# Patient Record
Sex: Female | Born: 1990
Health system: Southern US, Community
[De-identification: ages and names within clinical notes are randomized; demographics above are authoritative.]

## PROBLEM LIST (undated history)

## (undated) DIAGNOSIS — J309 Allergic rhinitis, unspecified: Secondary | ICD-10-CM

## (undated) DIAGNOSIS — R569 Unspecified convulsions: Secondary | ICD-10-CM

## (undated) DIAGNOSIS — G40909 Epilepsy, unspecified, not intractable, without status epilepticus: Secondary | ICD-10-CM

## (undated) HISTORY — PX: EYE SURGERY: SHX253

## (undated) HISTORY — PX: HAND SURGERY: SHX662

## (undated) HISTORY — PX: OTHER SURGICAL HISTORY: SHX169

---

## 2012-04-16 ENCOUNTER — Emergency Department (INDEPENDENT_AMBULATORY_CARE_PROVIDER_SITE_OTHER): Payer: BC Managed Care – PPO

## 2012-04-16 ENCOUNTER — Ambulatory Visit (INDEPENDENT_AMBULATORY_CARE_PROVIDER_SITE_OTHER): Payer: BC Managed Care – PPO | Admitting: Sports Medicine

## 2012-04-16 ENCOUNTER — Emergency Department (INDEPENDENT_AMBULATORY_CARE_PROVIDER_SITE_OTHER)
Admission: EM | Admit: 2012-04-16 | Discharge: 2012-04-16 | Disposition: A | Discharge status: Home / Self Care | Payer: BC Managed Care – PPO | Source: Home / Self Care | Attending: Family Medicine | Admitting: Family Medicine

## 2012-04-16 ENCOUNTER — Encounter: Payer: Self-pay | Admitting: Emergency Medicine

## 2012-04-16 DIAGNOSIS — M25532 Pain in left wrist: Secondary | ICD-10-CM

## 2012-04-16 DIAGNOSIS — M25539 Pain in unspecified wrist: Secondary | ICD-10-CM

## 2012-04-16 DIAGNOSIS — G5630 Lesion of radial nerve, unspecified upper limb: Secondary | ICD-10-CM

## 2012-04-16 DIAGNOSIS — M25531 Pain in right wrist: Secondary | ICD-10-CM | POA: Insufficient documentation

## 2012-04-16 HISTORY — DX: Allergic rhinitis, unspecified: J30.9

## 2012-04-16 HISTORY — DX: Epilepsy, unspecified, not intractable, without status epilepticus: G40.909

## 2012-04-16 MED ORDER — MELOXICAM 15 MG PO TABS: ORAL_TABLET | ORAL | Status: DC

## 2012-04-16 NOTE — ED Notes (Signed)
Left wrist pain started hurting today

## 2012-04-16 NOTE — ED Provider Notes (Signed)
History     CSN: [id number]  Arrival date & time 04/16/12  1401   None     Chief Complaint  Patient presents with  . Wrist Pain   HPI Comments: Pt states that she noticed acute onset of wrist pain in L wrist.  Has had similar pain in the past.  Pt denies any acute injury or fall.  Has a hx/o epilepsy. Last fall from this was approx 6-7 months ago.  Pain mainly over radial styloid. Has had some burning that radiates down 2nd metacarpal.      Patient is a 21 y.o. female presenting with wrist pain.  Wrist Pain Chronicity: acute on chronic  The current episode started yesterday. The problem has not changed since onset.    Past Medical History  Diagnosis Date  . Epilepsy   . Allergic rhinitis   . Epilepsy     History reviewed. No pertinent past surgical history.  Family History  Problem Relation Age of Onset  . Hyperlipidemia Mother   . Hypertension Father     History  Substance Use Topics  . Smoking status: Never Smoker   . Smokeless tobacco: Not on file  . Alcohol Use: No    OB History    Grav Para Term Preterm Abortions TAB SAB Ect Mult Living                  Review of Systems  All other systems reviewed and are negative.    Allergies  Review of patient's allergies indicates no known allergies.  Home Medications   Current Outpatient Rx  Name  Route  Sig  Dispense  Refill  . IBUPROFEN 200 MG PO TABS   Oral   Take 200 mg by mouth every 6 (six) hours as needed.         Marland Kitchen LAMOTRIGINE 150 MG PO TABS   Oral   Take 150 mg by mouth daily.           BP 114/68  Pulse 59  Temp 97.8 F (36.6 C) (Oral)  Resp 14  Ht 5\' 6"  (1.676 m)  Wt 121 lb (54.885 kg)  BMI 19.53 kg/m2  SpO2 100%  LMP 03/22/2012  Physical Exam  Constitutional: She appears well-developed and well-nourished.  HENT:  Head: Normocephalic and atraumatic.  Eyes: Conjunctivae normal are normal. Pupils are equal, round, and reactive to light.  Neck: Normal range of motion.  Neck supple.  Cardiovascular: Normal rate and regular rhythm.   Pulmonary/Chest: Effort normal and breath sounds normal.  Abdominal: Soft.  Musculoskeletal:       Hands:   ED Course  Procedures (including critical care time)  Labs Reviewed - No data to display Dg Wrist Complete Left  04/16/2012  *RADIOLOGY REPORT*  Clinical Data: Wrist pain  LEFT WRIST - COMPLETE 3+ VIEW  Comparison: None.  Findings: No acute fracture.  No dislocation.  Unremarkable soft tissues.  Negative ulnar variance.  IMPRESSION: No acute bony pathology.   Original Report Authenticated By: Jolaine Click, M.D.      1. Wrist pain, left   2. Radial neuropathy       MDM  Relatively broad differential for wrist pain. ? Radial neuropathy given distribution of pain and reproducibility with compression. Will consult sports medicine for formal evaluation.  Treatment plan per sports medicine.     The patient and/or caregiver has been counseled thoroughly with regard to treatment plan and/or medications prescribed including dosage, schedule, interactions, rationale for use, and  possible side effects and they verbalize understanding. Diagnoses and expected course of recovery discussed and will return if not improved as expected or if the condition worsens. Patient and/or caregiver verbalized understanding.             Doree Albee, MD 04/16/12 1536

## 2012-04-16 NOTE — Assessment & Plan Note (Signed)
Pain localized at the base of the second metacarpal, with recent URI, makes me suspect a synovitis. Meloxicam, wrist brace. She will come back to see me in one to 2 weeks, as she has had migratory arthralgia, I would certainly like to consider rheumatoid factor, ESR, and CRP levels if no better.

## 2012-04-16 NOTE — Progress Notes (Signed)
SPORTS MEDICINE CONSULTATION REPORT  Subjective:    I'm seeing this patient as a consultation for:  Dr. Alvester Morin  CC: Wrist pain  HPI: Megan Mueller is a very pleasant 21 year old female who comes in with a one-day history of pain that she localizes at the base of the second metacarpal bone of the dorsum of her left hand. She denies any trauma, denies any change in activity. The pain is localized, and really does not radiate. The patient, and present when flexing the wrist, and with palpation. She does recall in the past she's had pain on the other wrist as well, and it tends to alternate. She denies any fevers, chills, or night sweats, but does note a 30 pound weight loss over the past month or 2. She did discontinue Depakote around that time. She denies any rashes. The pain is moderate.  Past medical history, Surgical history, Family history, Social history, Allergies, and medications have been entered into the medical record, reviewed, and no changes needed.   Review of Systems: No headache, visual changes, nausea, vomiting, diarrhea, constipation, dizziness, abdominal pain, skin rash, fevers, chills, night sweats, weight loss, swollen lymph nodes, body aches, joint swelling, muscle aches, chest pain, shortness of breath, mood changes, visual or auditory hallucinations.   Objective:   Vitals:  Afebrile, vital signs stable. General: Well Developed, well nourished, and in no acute distress.  Neuro/Psych: Alert and oriented x3, extra-ocular muscles intact, able to move all 4 extremities.  Skin: Warm and dry, no rashes noted.  Respiratory: Not using accessory muscles, speaking in full sentences, trachea midline.  Cardiovascular: Pulses palpable, no extremity edema. Abdomen: Does not appear distended. Right Wrist: Inspection normal with no visible erythema or swelling. ROM smooth and normal with good flexion and extension and ulnar/radial deviation that is symmetrical with opposite wrist. Palpation is  normal over metacarpals, navicular, lunate, and TFCC; tendons without tenderness/ swelling, there is tenderness to palpation of the dorsal base of the second metacarpal bone. No snuffbox tenderness. No tenderness over Canal of Guyon. Strength 5/5 in all directions without pain. Negative Finkelstein, tinel's and phalens. Negative Watson's test.  I reviewed her x-rays, and they were negative for any sign of fracture, dislocation, degenerative change, or erosions.  Impression and Recommendations:   This case required medical decision making of moderate complexity.

## 2013-10-02 ENCOUNTER — Emergency Department (INDEPENDENT_AMBULATORY_CARE_PROVIDER_SITE_OTHER)
Admission: EM | Admit: 2013-10-02 | Discharge: 2013-10-02 | Disposition: A | Discharge status: Home / Self Care | Payer: BC Managed Care – PPO | Source: Home / Self Care | Attending: Family Medicine | Admitting: Family Medicine

## 2013-10-02 ENCOUNTER — Encounter: Payer: Self-pay | Admitting: Emergency Medicine

## 2013-10-02 DIAGNOSIS — N3000 Acute cystitis without hematuria: Secondary | ICD-10-CM

## 2013-10-02 LAB — POCT URINE PREGNANCY: PREG TEST UR: NEGATIVE

## 2013-10-02 LAB — POCT URINALYSIS DIP (MANUAL ENTRY)
BILIRUBIN UA: NEGATIVE
BILIRUBIN UA: NEGATIVE
GLUCOSE UA: NEGATIVE
Leukocytes, UA: NEGATIVE
Nitrite, UA: NEGATIVE
Protein Ur, POC: NEGATIVE
RBC UA: NEGATIVE
SPEC GRAV UA: 1.02 (ref 1.005–1.03)
UROBILINOGEN UA: 0.2 (ref 0–1)
pH, UA: 8 (ref 5–8)

## 2013-10-02 MED ORDER — NITROFURANTOIN MONOHYD MACRO 100 MG PO CAPS: 100.0000 mg | ORAL_CAPSULE | Freq: Two times a day (BID) | ORAL | Status: DC

## 2013-10-02 NOTE — ED Provider Notes (Signed)
CSN: [id number]     Arrival date & time 10/02/13  1240 History   First MD Initiated Contact with Patient 10/02/13 1328     Chief Complaint  Patient presents with  . Dysuria  . Urinary Frequency     HPI Comments: Patient complains of persistent frequency and mild dysuria and urgency for about a week.  No nocturia.  No abdominal or pelvic pain.  No nausea/vomiting.  No fevers, chills, and sweats.  Patient's last menstrual period was 09/01/2013.   Patient is a 23 y.o. female presenting with dysuria. The history is provided by the patient.  Dysuria Pain quality:  Burning Pain severity:  Mild Onset quality:  Gradual Duration:  5 days Timing:  Constant Progression:  Unchanged Chronicity:  Recurrent Recent urinary tract infections: no   Relieved by:  Nothing Worsened by:  Nothing tried Ineffective treatments:  Cranberry juice Urinary symptoms: frequent urination and hesitancy   Urinary symptoms: no discolored urine, no foul-smelling urine, no hematuria and no bladder incontinence   Associated symptoms: no abdominal pain, no fever, no flank pain, no genital lesions, no nausea, no vaginal discharge and no vomiting     Past Medical History  Diagnosis Date  . Epilepsy   . Allergic rhinitis   . Epilepsy    History reviewed. No pertinent past surgical history. Family History  Problem Relation Age of Onset  . Hyperlipidemia Mother   . Hypertension Father    History  Substance Use Topics  . Smoking status: Never Smoker   . Smokeless tobacco: Not on file  . Alcohol Use: No   OB History   Grav Para Term Preterm Abortions TAB SAB Ect Mult Living                 Review of Systems  Constitutional: Negative for fever.  Gastrointestinal: Negative for nausea, vomiting and abdominal pain.  Genitourinary: Positive for dysuria. Negative for flank pain and vaginal discharge.  All other systems reviewed and are negative.   Allergies  Review of patient's allergies indicates no known  allergies.  Home Medications   Prior to Admission medications   Medication Sig Start Date End Date Taking? Authorizing Provider  ibuprofen (ADVIL,MOTRIN) 200 MG tablet Take 200 mg by mouth every 6 (six) hours as needed.    Historical Provider, MD  lamoTRIgine (LAMICTAL) 150 MG tablet Take 150 mg by mouth daily.    Historical Provider, MD  meloxicam (MOBIC) 15 MG tablet One tab PO qAM with breakfast for 2 weeks, then daily prn pain. 04/16/12   Monica Bectonhomas J Thekkekandam, MD  nitrofurantoin, macrocrystal-monohydrate, (MACROBID) 100 MG capsule Take 1 capsule (100 mg total) by mouth 2 (two) times daily. 10/02/13   Lattie HawStephen A Darnell Stimson, MD   BP 99/62  Pulse 78  Temp(Src) 98.3 F (36.8 C) (Oral)  Resp 16  Ht 5\' 4"  (1.626 m)  Wt 126 lb (57.153 kg)  BMI 21.62 kg/m2  SpO2 99%  LMP 09/01/2013 Physical Exam Nursing notes and Vital Signs reviewed. Appearance:  Patient appears healthy, stated age, and in no acute distress Eyes:  Pupils are equal, round, and reactive to light and accomodation.  Extraocular movement is intact.  Conjunctivae are not inflamed  Pharynx:  Normal; moist mucous membranes  Neck:  Supple.  No adenopathy Lungs:  Clear to auscultation.  Breath sounds are equal.  Heart:  Regular rate and rhythm without murmurs, rubs, or gallops.  Abdomen:  Nontender without masses or hepatosplenomegaly.  Bowel sounds are present.  No  CVA or flank tenderness.  Extremities:  No edema.  No calf tenderness Skin:  No rash present.   ED Course  Procedures  None    Labs Reviewed  URINE CULTURE  POCT URINALYSIS DIP (MANUAL ENTRY) negative  POCT URINE PREGNANCY negative         MDM   1. Acute cystitis    Urine culture pending. Begin Macrobid. Followup with Family Doctor if not improved in one week.     Lattie HawStephen A Estephany Perot, MD 10/02/13 [id number]

## 2013-10-02 NOTE — Discharge Instructions (Signed)
Increase fluid intake. ° ° °Urinary Tract Infection °Urinary tract infections (UTIs) can develop anywhere along your urinary tract. Your urinary tract is your body's drainage system for removing wastes and extra water. Your urinary tract includes two kidneys, two ureters, a bladder, and a urethra. Your kidneys are a pair of bean-shaped organs. Each kidney is about the size of your fist. They are located below your ribs, one on each side of your spine. °CAUSES °Infections are caused by microbes, which are microscopic organisms, including fungi, viruses, and bacteria. These organisms are so small that they can only be seen through a microscope. Bacteria are the microbes that most commonly cause UTIs. °SYMPTOMS  °Symptoms of UTIs may vary by age and gender of the patient and by the location of the infection. Symptoms in young women typically include a frequent and intense urge to urinate and a painful, burning feeling in the bladder or urethra during urination. Older women and men are more likely to be tired, shaky, and weak and have muscle aches and abdominal pain. A fever may mean the infection is in your kidneys. Other symptoms of a kidney infection include pain in your back or sides below the ribs, nausea, and vomiting. °DIAGNOSIS °To diagnose a UTI, your caregiver will ask you about your symptoms. Your caregiver also will ask to provide a urine sample. The urine sample will be tested for bacteria and white blood cells. White blood cells are made by your body to help fight infection. °TREATMENT  °Typically, UTIs can be treated with medication. Because most UTIs are caused by a bacterial infection, they usually can be treated with the use of antibiotics. The choice of antibiotic and length of treatment depend on your symptoms and the type of bacteria causing your infection. °HOME CARE INSTRUCTIONS °· If you were prescribed antibiotics, take them exactly as your caregiver instructs you. Finish the medication even if  you feel better after you have only taken some of the medication. °· Drink enough water and fluids to keep your urine clear or pale yellow. °· Avoid caffeine, tea, and carbonated beverages. They tend to irritate your bladder. °· Empty your bladder often. Avoid holding urine for long periods of time. °· Empty your bladder before and after sexual intercourse. °· After a bowel movement, women should cleanse from front to back. Use each tissue only once. °SEEK MEDICAL CARE IF:  °· You have back pain. °· You develop a fever. °· Your symptoms do not begin to resolve within 3 days. °SEEK IMMEDIATE MEDICAL CARE IF:  °· You have severe back pain or lower abdominal pain. °· You develop chills. °· You have nausea or vomiting. °· You have continued burning or discomfort with urination. °MAKE SURE YOU:  °· Understand these instructions. °· Will watch your condition. °· Will get help right away if you are not doing well or get worse. °Document Released: 01/16/2005 Document Revised: 10/08/2011 Document Reviewed: 05/17/2011 °ExitCare® Patient Information ©2014 [employer]. ° °

## 2013-10-02 NOTE — ED Notes (Signed)
Reports frequency and dysuria x 3-5 days with varying intensity.

## 2013-10-04 LAB — URINE CULTURE

## 2013-10-05 ENCOUNTER — Telehealth: Payer: Self-pay | Admitting: Emergency Medicine

## 2013-10-08 ENCOUNTER — Emergency Department (INDEPENDENT_AMBULATORY_CARE_PROVIDER_SITE_OTHER): Payer: BC Managed Care – PPO

## 2013-10-08 ENCOUNTER — Emergency Department (INDEPENDENT_AMBULATORY_CARE_PROVIDER_SITE_OTHER)
Admission: EM | Admit: 2013-10-08 | Discharge: 2013-10-08 | Disposition: A | Discharge status: Home / Self Care | Payer: BC Managed Care – PPO | Source: Home / Self Care | Attending: Family Medicine | Admitting: Family Medicine

## 2013-10-08 ENCOUNTER — Encounter: Payer: Self-pay | Admitting: Emergency Medicine

## 2013-10-08 DIAGNOSIS — M25539 Pain in unspecified wrist: Secondary | ICD-10-CM

## 2013-10-08 DIAGNOSIS — M25531 Pain in right wrist: Secondary | ICD-10-CM

## 2013-10-08 NOTE — ED Provider Notes (Signed)
CSN: [id number]     Arrival date & time 10/08/13  1402 History   First MD Initiated Contact with Patient 10/08/13 1500     Chief Complaint  Patient presents with  . Hand Pain      HPI Comments: Patient was pushing down on a table yesterday with her wrists hyperflexed and developed pain in her right wrist.  She has had intermittent positional pain, such as when rotating her car steering wheel.  No recent injury.  Patient is a 23 y.o. female presenting with wrist pain. The history is provided by the patient.  Wrist Pain This is a new problem. The current episode started yesterday. Episode frequency: occasionally. The problem has not changed since onset.Associated symptoms comments: none. Exacerbated by: dorsiflexing wrist. Nothing relieves the symptoms. Treatments tried: ibuprofen. The treatment provided mild relief.    Past Medical History  Diagnosis Date  . Epilepsy   . Allergic rhinitis   . Epilepsy    Past Surgical History  Procedure Laterality Date  . Hand surgery Left   . Eye surgery Left    Family History  Problem Relation Age of Onset  . Hyperlipidemia Mother   . Hypertension Father   . Diabetes Father    History  Substance Use Topics  . Smoking status: Never Smoker   . Smokeless tobacco: Not on file  . Alcohol Use: No   OB History   Grav Para Term Preterm Abortions TAB SAB Ect Mult Living                 Review of Systems  All other systems reviewed and are negative.   Allergies  Review of patient's allergies indicates no known allergies.  Home Medications   Prior to Admission medications   Medication Sig Start Date End Date Taking? Authorizing Provider  ibuprofen (ADVIL,MOTRIN) 200 MG tablet Take 200 mg by mouth every 6 (six) hours as needed.    Historical Provider, MD  lamoTRIgine (LAMICTAL) 150 MG tablet Take 150 mg by mouth daily.    Historical Provider, MD  meloxicam (MOBIC) 15 MG tablet One tab PO qAM with breakfast for 2 weeks, then daily prn pain.  04/16/12   Monica Bectonhomas J Thekkekandam, MD  nitrofurantoin, macrocrystal-monohydrate, (MACROBID) 100 MG capsule Take 1 capsule (100 mg total) by mouth 2 (two) times daily. 10/02/13   Lattie HawStephen A Beese, MD   BP 100/60  Pulse 85  Temp(Src) 98.2 F (36.8 C) (Oral)  Resp 16  Ht 5\' 4"  (1.626 m)  Wt 127 lb (57.607 kg)  BMI 21.79 kg/m2  SpO2 98%  LMP 09/03/2013 Physical Exam  Nursing note and vitals reviewed. Constitutional: She is oriented to person, place, and time. She appears well-developed and well-nourished. No distress.  HENT:  Head: Atraumatic.  Eyes: Conjunctivae are normal. Pupils are equal, round, and reactive to light.  Musculoskeletal:       Right wrist: Normal.       Right hand: Normal. Normal sensation noted. Normal strength noted.  Neurological: She is alert and oriented to person, place, and time.  Skin: Skin is warm and dry. No rash noted.    ED Course  Procedures  none     Imaging Review Dg Wrist Complete Right  10/08/2013   CLINICAL DATA:  No injury, lateral pain right wrist  EXAM: RIGHT WRIST - COMPLETE 3+ VIEW  COMPARISON:  None.  FINDINGS: There is no evidence of fracture or dislocation. There is no evidence of arthropathy or other focal bone abnormality.  Soft tissues are unremarkable.  IMPRESSION: Negative.   Electronically Signed   By: Esperanza Heiraymond  Rubner M.D.   On: 10/08/2013 15:59     MDM   1. Wrist pain, right; note normal exam     May take Ibuprofen 200mg , 4 tabs every 8 hours with food when flare-ups of pain recur. Followup with Dr. Rodney Langtonhomas Thekkekandam ([facility])    Lattie HawStephen A Beese, MD 10/11/13 1004

## 2013-10-08 NOTE — ED Notes (Signed)
Pt c/o RT wrist and hand pain x 1 day. Denies injury. She took IBF last night with no relief.

## 2013-10-08 NOTE — Discharge Instructions (Signed)
May take Ibuprofen 200mg , 4 tabs every 8 hours with food when flare-ups of pain recur.

## 2013-10-13 ENCOUNTER — Other Ambulatory Visit: Payer: Self-pay | Admitting: Family Medicine

## 2013-10-14 ENCOUNTER — Telehealth: Payer: Self-pay | Admitting: Emergency Medicine

## 2013-10-14 NOTE — ED Notes (Signed)
Inquired about patient's status; encourage them to call with questions/concerns.  

## 2013-10-19 ENCOUNTER — Other Ambulatory Visit: Payer: Self-pay | Admitting: Family Medicine

## 2015-08-16 ENCOUNTER — Emergency Department
Admission: EM | Admit: 2015-08-16 | Discharge: 2015-08-16 | Disposition: A | Discharge status: Home / Self Care | Payer: BLUE CROSS/BLUE SHIELD | Source: Home / Self Care | Attending: Family Medicine | Admitting: Family Medicine

## 2015-08-16 ENCOUNTER — Encounter: Payer: Self-pay | Admitting: *Deleted

## 2015-08-16 DIAGNOSIS — Z8669 Personal history of other diseases of the nervous system and sense organs: Secondary | ICD-10-CM

## 2015-08-16 DIAGNOSIS — R112 Nausea with vomiting, unspecified: Secondary | ICD-10-CM | POA: Diagnosis not present

## 2015-08-16 LAB — POCT CBC W AUTO DIFF (K'VILLE URGENT CARE)

## 2015-08-16 MED ORDER — ONDANSETRON 4 MG PO TBDP
4.0000 mg | ORAL_TABLET | Freq: Once | ORAL | Status: AC
  Administered 2015-08-16: 4 mg via ORAL

## 2015-08-16 MED ORDER — SODIUM CHLORIDE 0.9 % IV BOLUS (SEPSIS)
1000.0000 mL | Freq: Once | INTRAVENOUS | Status: AC
  Administered 2015-08-16: 1000 mL via INTRAVENOUS

## 2015-08-16 MED ORDER — ONDANSETRON 4 MG PO TBDP
4.0000 mg | ORAL_TABLET | Freq: Four times a day (QID) | ORAL | Status: DC | PRN
  Administered 2015-08-16: 4 mg via ORAL

## 2015-08-16 MED ORDER — PROMETHAZINE HCL 25 MG PO TABS: 25.0000 mg | ORAL_TABLET | Freq: Four times a day (QID) | ORAL | Status: DC | PRN

## 2015-08-16 NOTE — ED Provider Notes (Signed)
CSN: [id number]     Arrival date & time 08/16/15  1454 History   None    Chief Complaint  Patient presents with  . Emesis   (Consider location/radiation/quality/duration/timing/severity/associated sxs/prior Treatment) HPI The pt is a 25yo female brought to [facility] by her mother with concern for persistent nausea and 3 episodes of vomiting since 8AM this morning after having 2 seizures.  Vomiting after seizures is normal for pt but usually resolves faster. She has been taking her Lamictal as prescribed with no missed doses.  Pt was playing video games last night, which she has done before, but thinks she may have taken a bath that was too hot, triggering her seizure.  She is c/o mild generalized abdominal pain with the vomiting.  First seizure this morning was around 3AM, unwitnessed, she woke up on the floor after she believes she fell out of her bed, then a second seizure 10-15 minutes later that lasted about 1-2 minutes.  Pt was in bed with her parents when the 2nd seizure started.  Pt does not have any nightstand or furniture where she fell out of bed but there were items on the floor she may have hit her head on. Pt denies headache, wounds, bumps, or bruises on her head. Denies known injury from the fall out of bed. No injury occurred during second seizure as she was in bed, witnessed by her parents.  Per medical records, pt did call her neurologist, Dr. Inez Catalina at [facility] this morning, who recommended pt increase the dose of her Lamictal, and called in a new prescription.  Pt came to [facility] for treatment of the continued nausea.  Last seizure before today was 2 years ago.  No recent illness including no cough, congestion, fever, or diarrhea. No sick contacts or recent travel.  Past Medical History  Diagnosis Date  . Epilepsy (HCC)   . Allergic rhinitis   . Epilepsy [facility])    Past Surgical History  Procedure Laterality Date  . Hand surgery Left   . Eye surgery Left    Family History  Problem  Relation Age of Onset  . Hyperlipidemia Mother   . Hypertension Father   . Diabetes Father    Social History  Substance Use Topics  . Smoking status: Never Smoker   . Smokeless tobacco: None  . Alcohol Use: No   OB History    No data available     Review of Systems  Constitutional: Negative for fever and chills.  HENT: Negative for congestion, ear pain, sore throat, trouble swallowing and voice change.   Eyes: Negative for photophobia, pain and visual disturbance.  Respiratory: Negative for cough and shortness of breath.   Cardiovascular: Negative for chest pain and palpitations.  Gastrointestinal: Positive for nausea and vomiting. Negative for abdominal pain and diarrhea.  Musculoskeletal: Negative for myalgias, back pain, arthralgias, neck pain and neck stiffness.  Skin: Negative for rash.  Neurological: Positive for seizures. Negative for dizziness, weakness, light-headedness, numbness and headaches.    Allergies  Review of patient's allergies indicates no known allergies.  Home Medications   Prior to Admission medications   Medication Sig Start Date End Date Taking? Authorizing Provider  AMITRIPTYLINE HCL PO Take by mouth.   Yes Historical Provider, MD  lamoTRIgine (LAMICTAL) 150 MG tablet Take 150 mg by mouth daily.   Yes Historical Provider, MD  promethazine (PHENERGAN) 25 MG tablet Take 1 tablet (25 mg total) by mouth every 6 (six) hours as needed for nausea or vomiting.  08/16/15   Junius FinnerErin O'Malley, PA-C   Meds Ordered and Administered this Visit   Medications  ondansetron (ZOFRAN-ODT) disintegrating tablet 4 mg (4 mg Oral Given 08/16/15 1655)  ondansetron (ZOFRAN-ODT) disintegrating tablet 4 mg (4 mg Oral Given 08/16/15 1519)  sodium chloride 0.9 % bolus 1,000 mL (1,000 mLs Intravenous Given 08/16/15 1553)    BP 117/77 mmHg  Pulse 72  Temp(Src) 98 F (36.7 C) (Oral)  Wt 120 lb (54.432 kg)  SpO2 99%  LMP 08/16/2015 No data found.   Physical Exam   Constitutional: She is oriented to person, place, and time. She appears well-developed and well-nourished. No distress.  Pt sitting on exam bed, texting on her phone. NAD.  HENT:  Head: Normocephalic and atraumatic.  Right Ear: Tympanic membrane normal.  Left Ear: Tympanic membrane normal.  Nose: Nose normal.  Mouth/Throat: Uvula is midline, oropharynx is clear and moist and mucous membranes are normal.  No evidence of head trauma.  Eyes: Conjunctivae and EOM are normal. Pupils are equal, round, and reactive to light. No scleral icterus.  Neck: Normal range of motion. Neck supple.  No midline bone tenderness, no crepitus or step-offs.   Cardiovascular: Normal rate, regular rhythm and normal heart sounds.   Pulmonary/Chest: Effort normal and breath sounds normal. No respiratory distress. She has no wheezes. She has no rales.  Abdominal: Soft. She exhibits no distension and no mass. There is no tenderness. There is no rebound and no guarding.  Musculoskeletal: Normal range of motion.  Neurological: She is alert and oriented to person, place, and time. She has normal strength. No cranial nerve deficit or sensory deficit. She displays a negative Romberg sign. Coordination and gait normal. GCS eye subscore is 4. GCS verbal subscore is 5. GCS motor subscore is 6.  CN II-XII in tact. Speech is clear. Alert to person, place, and time. Able to follow 2 step commands. Finger to nose coordination normal. Normal gait.   Skin: Skin is warm and dry. She is not diaphoretic.  Nursing note and vitals reviewed.   ED Course  Procedures (including critical care time)  Labs Review Labs Reviewed  LAMOTRIGINE LEVEL  BASIC METABOLIC PANEL WITH GFR  POCT CBC W AUTO DIFF ([facility])    Imaging Review No results found.    MDM   1. Non-intractable vomiting with nausea, unspecified vomiting type   2. History of epilepsy    Pt is a 25yo female with hx of epilepsy c/o nausea and vomiting after  2 seizures this morning. Pt unsure if she hit her head, however, no evidence of head trauma. Pt denies headache, dizziness or change in vision. Normal neuro exam.  Not on blood thinners.  Medical records reviewed including pt's telephone notes with neurologist, Dr. Inez Catalina'Donovan this morning.   No vomiting while in UC.  Pt given 1L IV fluids and 8mg  Zofran ODT  No vomiting while in UC.  Vitals WNL Labs: CBC- unremarkable.  BMP and Lamictal level: pending.  Will hold off on CT scan Pt is being driven by her mother, lives with her parents. Pt feels comfortable being discharged home. Advised not to drive until cleared by her neurologist. Take new Lamictal dose starting this evening.  F/u with Neurologist for continued monitoring of medication and seizures. Discussed symptoms that warrant emergent care in the ED. Patient verbalized understanding and agreement with treatment plan.    Junius Finnerrin O'Malley, PA-C 08/16/15 1738

## 2015-08-16 NOTE — ED Notes (Signed)
Pt c/o nausea and vomiting since 8am this morning. She has tried fluids but is unable to hold them down currently. Abdominal pain only while vomiting. She has a h/o epilepsy, and had 2 seizures last night. She typically has nausea and some vomiting after seizures but "never this bad".

## 2015-08-16 NOTE — Discharge Instructions (Signed)
Be sure to pick up your new prescription of Lamictal and take as prescribed by your neurologist.  If you continue to have seizures, unable to keep down fluids, severe headache, dizziness, difficulty walking, or other new concerning symptoms develop, please call 911 or go to closest emergency department.  You will be called in 2-3 days for your labs that will show your electrolytes and lamictal level. If the lamictal level is low, please call to report to your neurologist as it would be expected your levels to increase due to the recent dose change after your blood was drawn.    It is important that you DO NOT drive until you are cleared by your neurologist as it will be dangerous for yourself as well as others on the road with you.    Nausea and Vomiting Nausea means you feel sick to your stomach. Throwing up (vomiting) is a reflex where stomach contents come out of your mouth. HOME CARE   Take medicine as told by your doctor.  Do not force yourself to eat. However, you do need to drink fluids.  If you feel like eating, eat a normal diet as told by your doctor.  Eat rice, wheat, potatoes, bread, lean meats, yogurt, fruits, and vegetables.  Avoid high-fat foods.  Drink enough fluids to keep your pee (urine) clear or pale yellow.  Ask your doctor how to replace body fluid losses (rehydrate). Signs of body fluid loss (dehydration) include:  Feeling very thirsty.  Dry lips and mouth.  Feeling dizzy.  Dark pee.  Peeing less than normal.  Feeling confused.  Fast breathing or heart rate. GET HELP RIGHT AWAY IF:   You have blood in your throw up.  You have black or bloody poop (stool).  You have a bad headache or stiff neck.  You feel confused.  You have bad belly (abdominal) pain.  You have chest pain or trouble breathing.  You do not pee at least once every 8 hours.  You have cold, clammy skin.  You keep throwing up after 24 to 48 hours.  You have a fever. MAKE  SURE YOU:   Understand these instructions.  Will watch your condition.  Will get help right away if you are not doing well or get worse.   This information is not intended to replace advice given to you by your health care provider. Make sure you discuss any questions you have with your health care provider.   Document Released: 09/25/2007 Document Revised: 07/01/2011 Document Reviewed: 09/07/2010 Elsevier Interactive Patient Education [employer] Inc.

## 2015-08-17 LAB — BASIC METABOLIC PANEL WITH GFR
BUN: 12 mg/dL (ref 7–25)
CO2: 24 mmol/L (ref 20–31)
Calcium: 9.1 mg/dL (ref 8.6–10.2)
Chloride: 104 mmol/L (ref 98–110)
Creat: 1.43 mg/dL — ABNORMAL HIGH (ref 0.50–1.10)
GFR, Est African American: 60 mL/min (ref >=60)
GFR, Est Non African American: 52 mL/min — ABNORMAL LOW (ref >=60)
Glucose, Bld: 93 mg/dL (ref 65–99)
Potassium: 3.7 mmol/L (ref 3.5–5.3)
Sodium: 139 mmol/L (ref 135–146)

## 2015-08-19 ENCOUNTER — Telehealth: Payer: Self-pay | Admitting: Emergency Medicine

## 2015-08-19 LAB — LAMOTRIGINE LEVEL: Lamotrigine Lvl: 11 ug/mL (ref 4.0–18.0)

## 2015-09-01 ENCOUNTER — Encounter: Payer: Self-pay | Admitting: Sports Medicine

## 2015-09-01 ENCOUNTER — Ambulatory Visit (INDEPENDENT_AMBULATORY_CARE_PROVIDER_SITE_OTHER): Payer: BLUE CROSS/BLUE SHIELD | Admitting: Sports Medicine

## 2015-09-01 VITALS — BP 106/73 | HR 81 | Resp 16 | Ht 64.0 in | Wt 125.0 lb

## 2015-09-01 DIAGNOSIS — Z0000 Encounter for general adult medical examination without abnormal findings: Secondary | ICD-10-CM | POA: Diagnosis not present

## 2015-09-01 DIAGNOSIS — G40409 Other generalized epilepsy and epileptic syndromes, not intractable, without status epilepticus: Secondary | ICD-10-CM | POA: Diagnosis not present

## 2015-09-01 LAB — COMPREHENSIVE METABOLIC PANEL WITH GFR
Albumin: 4.8 g/dL (ref 3.6–5.1)
BUN: 11 mg/dL (ref 7–25)
Chloride: 100 mmol/L (ref 98–110)
Potassium: 3.8 mmol/L (ref 3.5–5.3)
Total Protein: 7.2 g/dL (ref 6.1–8.1)

## 2015-09-01 LAB — LIPID PANEL
Cholesterol: 185 mg/dL (ref 125–200)
HDL: 58 mg/dL (ref >=46)
LDL Cholesterol: 119 mg/dL (ref <130)
Total CHOL/HDL Ratio: 3.2 Ratio (ref <=5.0)
Triglycerides: 41 mg/dL (ref <150)
VLDL: 8 mg/dL (ref <30)

## 2015-09-01 LAB — COMPREHENSIVE METABOLIC PANEL
ALT: 19 U/L (ref 6–29)
AST: 16 U/L (ref 10–30)
Alkaline Phosphatase: 62 U/L (ref 33–115)
CO2: 28 mmol/L (ref 20–31)
Calcium: 9.6 mg/dL (ref 8.6–10.2)
Creat: 0.89 mg/dL (ref 0.50–1.10)
Glucose, Bld: 82 mg/dL (ref 65–99)
Sodium: 138 mmol/L (ref 135–146)
Total Bilirubin: 0.5 mg/dL (ref 0.2–1.2)

## 2015-09-01 LAB — CBC
HCT: 36.1 % (ref 35.0–45.0)
Hemoglobin: 12.2 g/dL (ref 11.7–15.5)
MCH: 31 pg (ref 27.0–33.0)
MCHC: 33.8 g/dL (ref 32.0–36.0)
MCV: 91.9 fL (ref 80.0–100.0)
MPV: 8.6 fL (ref 7.5–12.5)
Platelets: 351 K/uL (ref 140–400)
RBC: 3.93 MIL/uL (ref 3.80–5.10)
RDW: 13.1 % (ref 11.0–15.0)
WBC: 6.3 10*3/uL (ref 3.8–10.8)

## 2015-09-01 LAB — HEMOGLOBIN A1C
Hgb A1c MFr Bld: 5 % (ref <5.7)
Mean Plasma Glucose: 97 mg/dL

## 2015-09-01 LAB — TSH: TSH: 2.45 mIU/L

## 2015-09-01 NOTE — Assessment & Plan Note (Signed)
Checking routine blood work, it sounds though she had a small bump in her creatinine, but was dehydrated at the time. Rechecking.

## 2015-09-01 NOTE — Assessment & Plan Note (Signed)
Continue amitriptyline and Lamictal. Continue follow-up with neurologist.  Checking Lamictal levels, most recent brain MRI was 3 years ago. Normal.

## 2015-09-01 NOTE — Progress Notes (Signed)
  Subjective:    CC: Establish care.   HPI:  This is a pleasant 25 year old female, she is healthy with the exception of a seizure disorder, she is followed by neurologist and is on amitriptyline and Lamictal, most recent seizure was a few months ago, and 2 years before that, currently not driving.  Increased creatinine: Recently checked, she was dehydrated at the time with nausea and vomiting.  Past medical history, Surgical history, Family history not pertinant except as noted below, Social history, Allergies, and medications have been entered into the medical record, reviewed, and no changes needed.   Review of Systems: No headache, visual changes, nausea, vomiting, diarrhea, constipation, dizziness, abdominal pain, skin rash, fevers, chills, night sweats, swollen lymph nodes, weight loss, chest pain, body aches, joint swelling, muscle aches, shortness of breath, mood changes, visual or auditory hallucinations.  Objective:    General: Well Developed, well nourished, and in no acute distress.  Neuro: Alert and oriented x3, extra-ocular muscles intact, sensation grossly intact.  HEENT: Normocephalic, atraumatic, pupils equal round reactive to light, neck supple, no masses, no lymphadenopathy, thyroid nonpalpable.  Skin: Warm and dry, no rashes noted.  Cardiac: Regular rate and rhythm, no murmurs rubs or gallops.  Respiratory: Clear to auscultation bilaterally. Not using accessory muscles, speaking in full sentences.  Abdominal: Soft, nontender, nondistended, positive bowel sounds, no masses, no organomegaly.  Musculoskeletal: Shoulder, elbow, wrist, hip, knee, ankle stable, and with full range of motion.  Impression and Recommendations:    The patient was counselled, risk factors were discussed, anticipatory guidance given.

## 2015-09-02 LAB — HIV ANTIBODY (ROUTINE TESTING W REFLEX): HIV 1&2 Ab, 4th Generation: NONREACTIVE

## 2015-09-05 LAB — LAMOTRIGINE LEVEL: Lamotrigine Lvl: 20.9 ug/mL — ABNORMAL HIGH (ref 4.0–18.0)

## 2015-10-19 ENCOUNTER — Encounter: Payer: Self-pay | Admitting: Emergency Medicine

## 2015-10-19 ENCOUNTER — Emergency Department (HOSPITAL_BASED_OUTPATIENT_CLINIC_OR_DEPARTMENT_OTHER): Payer: BLUE CROSS/BLUE SHIELD

## 2015-10-19 ENCOUNTER — Emergency Department (HOSPITAL_BASED_OUTPATIENT_CLINIC_OR_DEPARTMENT_OTHER)
Admission: EM | Admit: 2015-10-19 | Discharge: 2015-10-19 | Disposition: A | Discharge status: Home / Self Care | Payer: BLUE CROSS/BLUE SHIELD | Attending: Emergency Medicine | Admitting: Emergency Medicine

## 2015-10-19 ENCOUNTER — Emergency Department (INDEPENDENT_AMBULATORY_CARE_PROVIDER_SITE_OTHER)
Admission: EM | Admit: 2015-10-19 | Discharge: 2015-10-19 | Payer: BLUE CROSS/BLUE SHIELD | Source: Home / Self Care | Attending: Family Medicine | Admitting: Family Medicine

## 2015-10-19 ENCOUNTER — Encounter (HOSPITAL_BASED_OUTPATIENT_CLINIC_OR_DEPARTMENT_OTHER): Payer: Self-pay | Admitting: Emergency Medicine

## 2015-10-19 DIAGNOSIS — Z8669 Personal history of other diseases of the nervous system and sense organs: Secondary | ICD-10-CM | POA: Insufficient documentation

## 2015-10-19 DIAGNOSIS — R1084 Generalized abdominal pain: Secondary | ICD-10-CM

## 2015-10-19 DIAGNOSIS — B373 Candidiasis of vulva and vagina: Secondary | ICD-10-CM | POA: Insufficient documentation

## 2015-10-19 DIAGNOSIS — N83201 Unspecified ovarian cyst, right side: Secondary | ICD-10-CM | POA: Diagnosis not present

## 2015-10-19 DIAGNOSIS — B3731 Acute candidiasis of vulva and vagina: Secondary | ICD-10-CM

## 2015-10-19 DIAGNOSIS — R1031 Right lower quadrant pain: Secondary | ICD-10-CM | POA: Diagnosis present

## 2015-10-19 LAB — URINALYSIS, ROUTINE W REFLEX MICROSCOPIC
Bilirubin Urine: NEGATIVE
GLUCOSE, UA: NEGATIVE mg/dL
Ketones, ur: 15 mg/dL — AB
Leukocytes, UA: NEGATIVE
Nitrite: NEGATIVE
PROTEIN: NEGATIVE mg/dL
SPECIFIC GRAVITY, URINE: 1.023 (ref 1.005–1.030)
pH: 6.5 (ref 5.0–8.0)

## 2015-10-19 LAB — COMPREHENSIVE METABOLIC PANEL
ALBUMIN: 4.8 g/dL (ref 3.5–5.0)
ALT: 15 U/L (ref 14–54)
ANION GAP: 9 (ref 5–15)
AST: 20 U/L (ref 15–41)
Alkaline Phosphatase: 63 U/L (ref 38–126)
BUN: 13 mg/dL (ref 6–20)
CHLORIDE: 100 mmol/L — AB (ref 101–111)
CO2: 26 mmol/L (ref 22–32)
Calcium: 9.1 mg/dL (ref 8.9–10.3)
Creatinine, Ser: 0.92 mg/dL (ref 0.44–1.00)
GFR calc non Af Amer: >60 mL/min (ref >60)
Glucose, Bld: 101 mg/dL — ABNORMAL HIGH (ref 65–99)
Potassium: 3.5 mmol/L (ref 3.5–5.1)
SODIUM: 135 mmol/L (ref 135–145)
Total Bilirubin: 1.1 mg/dL (ref 0.3–1.2)
Total Protein: 7.8 g/dL (ref 6.5–8.1)

## 2015-10-19 LAB — CBC WITH DIFFERENTIAL/PLATELET
BASOS PCT: 0 %
Basophils Absolute: 0 10*3/uL (ref 0.0–0.1)
EOS ABS: 0 10*3/uL (ref 0.0–0.7)
Eosinophils Relative: 0 %
HCT: 37 % (ref 36.0–46.0)
HEMOGLOBIN: 12.5 g/dL (ref 12.0–15.0)
LYMPHS ABS: 0.8 10*3/uL (ref 0.7–4.0)
LYMPHS PCT: 6 %
MCH: 31.8 pg (ref 26.0–34.0)
MCHC: 33.8 g/dL (ref 30.0–36.0)
MCV: 94.1 fL (ref 78.0–100.0)
MONOS PCT: 7 %
Monocytes Absolute: 0.9 10*3/uL (ref 0.1–1.0)
NEUTROS ABS: 11 10*3/uL — AB (ref 1.7–7.7)
NEUTROS PCT: 87 %
PLATELETS: 280 10*3/uL (ref 150–400)
RBC: 3.93 MIL/uL (ref 3.87–5.11)
RDW: 13.1 % (ref 11.5–15.5)
WBC: 12.8 10*3/uL — ABNORMAL HIGH (ref 4.0–10.5)

## 2015-10-19 LAB — URINE MICROSCOPIC-ADD ON

## 2015-10-19 LAB — WET PREP, GENITAL
CLUE CELLS WET PREP: NONE SEEN
Sperm: NONE SEEN
Trich, Wet Prep: NONE SEEN

## 2015-10-19 LAB — LIPASE, BLOOD: LIPASE: 16 U/L (ref 11–51)

## 2015-10-19 LAB — PREGNANCY, URINE: PREG TEST UR: NEGATIVE

## 2015-10-19 MED ORDER — MORPHINE SULFATE (PF) 4 MG/ML IV SOLN
4.0000 mg | Freq: Once | INTRAVENOUS | Status: AC
  Administered 2015-10-19: 4 mg via INTRAVENOUS
  Filled 2015-10-19: qty 1

## 2015-10-19 MED ORDER — HYDROCODONE-ACETAMINOPHEN 5-325 MG PO TABS: 1.0000 | ORAL_TABLET | ORAL | Status: DC | PRN

## 2015-10-19 MED ORDER — SODIUM CHLORIDE 0.9 % IV BOLUS (SEPSIS)
1000.0000 mL | Freq: Once | INTRAVENOUS | Status: AC
  Administered 2015-10-19: 1000 mL via INTRAVENOUS

## 2015-10-19 MED ORDER — IOPAMIDOL (ISOVUE-300) INJECTION 61%
100.0000 mL | Freq: Once | INTRAVENOUS | Status: AC | PRN
  Administered 2015-10-19: 100 mL via INTRAVENOUS

## 2015-10-19 MED ORDER — FLUCONAZOLE 50 MG PO TABS
150.0000 mg | ORAL_TABLET | Freq: Once | ORAL | Status: AC
  Administered 2015-10-19: 150 mg via ORAL
  Filled 2015-10-19: qty 1

## 2015-10-19 MED ORDER — ONDANSETRON HCL 4 MG PO TABS: 4.0000 mg | ORAL_TABLET | Freq: Four times a day (QID) | ORAL | Status: DC

## 2015-10-19 MED ORDER — ONDANSETRON HCL 4 MG/2ML IJ SOLN
4.0000 mg | Freq: Once | INTRAMUSCULAR | Status: AC
  Administered 2015-10-19: 4 mg via INTRAVENOUS
  Filled 2015-10-19: qty 2

## 2015-10-19 NOTE — ED Notes (Signed)
Reports sudden onset of mid abdominal pain, nausea and vomiting after eating supper about 1 hour ago.

## 2015-10-19 NOTE — Discharge Instructions (Signed)

## 2015-10-19 NOTE — ED Provider Notes (Signed)
CSN: [id number]     Arrival date & time 10/19/15  1929 History  By signing my name below, I, Megan Mueller, attest that this documentation has been prepared under the direction and in the presence of Jacalyn LefevreJulie Itzel Lowrimore, MD. Electronically signed, Megan Mueller, ED Scribe. 10/19/2015. 10:06 PM.   Chief Complaint  Patient presents with  . Abdominal Pain   The history is provided by the patient and a parent. No language interpreter was used.   HPI Comments: Megan Mueller is a 25 y.o. female who presents to the Emergency Department complaining of sudden onset lower-mid abdominal pain that started this evening. Pt's family states they ate dinner 1.5 hours ago and pt's abdominal pain started on the drive home with 2 episodes of vomiting. Upon arriving home, she noticed that she had started her menstrual period. Pt also states her family took her to an urgent care in [place] and was told to come here for further evaluation. Pt also reports another episode of vomiting at the urgent care less than one hour ago. Pt denies any Hx of abdominal surgeries. Pt states she does not feel nauseas currently. Pt denies any fever.  Past Medical History  Diagnosis Date  . Epilepsy (HCC)   . Allergic rhinitis   . Epilepsy [facility](HCC)    Past Surgical History  Procedure Laterality Date  . Hand surgery Left   . Eye surgery Left    Family History  Problem Relation Age of Onset  . Hyperlipidemia Mother   . Hypertension Father   . Diabetes Father    Social History  Substance Use Topics  . Smoking status: Never Smoker   . Smokeless tobacco: None  . Alcohol Use: No   OB History    No data available     Review of Systems  Constitutional: Negative for fever.  Gastrointestinal: Positive for vomiting and abdominal pain.  All other systems reviewed and are negative.     Allergies  Review of patient's allergies indicates no known allergies.  Home Medications   Prior to Admission medications   Medication Sig  Start Date End Date Taking? Authorizing Provider  AMITRIPTYLINE HCL PO Take by mouth.    Historical Provider, MD  HYDROcodone-acetaminophen (NORCO/VICODIN) 5-325 MG tablet Take 1 tablet by mouth every 4 (four) hours as needed. 10/19/15   Jacalyn LefevreJulie Madelena Maturin, MD  lamoTRIgine (LAMICTAL) 150 MG tablet Take 150 mg by mouth daily.    Historical Provider, MD  ondansetron (ZOFRAN) 4 MG tablet Take 1 tablet (4 mg total) by mouth every 6 (six) hours. 10/19/15   Jacalyn LefevreJulie Nabria Nevin, MD   BP 117/79 mmHg  Pulse 78  Temp(Src) 97.8 F (36.6 C) (Oral)  Resp 18  Ht 5\' 4"  (1.626 m)  Wt 120 lb (54.432 kg)  BMI 20.59 kg/m2  SpO2 100%  LMP 10/19/2015 (Exact Date) Physical Exam  Constitutional: She is oriented to person, place, and time. She appears well-developed and well-nourished. No distress.  HENT:  Head: Normocephalic and atraumatic.  Neck: Normal range of motion.  Pulmonary/Chest: Effort normal.  Abdominal: There is tenderness.  Bilateral lower abdominal pain with tenderness to both sides.  Genitourinary: Uterus is tender. Right adnexum displays no tenderness. Left adnexum displays no tenderness. There is bleeding in the vagina.  Neurological: She is alert and oriented to person, place, and time.  Skin: Skin is warm and dry. She is not diaphoretic.  Psychiatric: She has a normal mood and affect. Judgment normal.  Nursing note and vitals reviewed.   ED  Course  Procedures  DIAGNOSTIC STUDIES: Oxygen Saturation is 100% on RA, normal by my interpretation.  COORDINATION OF CARE: 8:19 PM-Will order urinalysis and imaging. Discussed treatment plan with pt at bedside and pt agreed to plan.   Labs Review Labs Reviewed  WET PREP, GENITAL - Abnormal; Notable for the following:    Yeast Wet Prep HPF POC PRESENT (*)    WBC, Wet Prep HPF POC MODERATE (*)    All other components within normal limits  URINALYSIS, ROUTINE W REFLEX MICROSCOPIC (NOT AT [facility]) - Abnormal; Notable for the following:    APPearance  CLOUDY (*)    Hgb urine dipstick MODERATE (*)    Ketones, ur 15 (*)    All other components within normal limits  URINE MICROSCOPIC-ADD ON - Abnormal; Notable for the following:    Squamous Epithelial / LPF 6-30 (*)    Bacteria, UA FEW (*)    All other components within normal limits  COMPREHENSIVE METABOLIC PANEL - Abnormal; Notable for the following:    Chloride 100 (*)    Glucose, Bld 101 (*)    All other components within normal limits  CBC WITH DIFFERENTIAL/PLATELET - Abnormal; Notable for the following:    WBC 12.8 (*)    Neutro Abs 11.0 (*)    All other components within normal limits  PREGNANCY, URINE  LIPASE, BLOOD  GC/CHLAMYDIA PROBE AMP ([place]) NOT AT [facility]    Imaging Review Ct Abdomen Pelvis W Contrast  10/19/2015  CLINICAL DATA:  Sudden onset of abdominal pain, postprandial. EXAM: CT ABDOMEN AND PELVIS WITH CONTRAST TECHNIQUE: Multidetector CT imaging of the abdomen and pelvis was performed using the standard protocol following bolus administration of intravenous contrast. CONTRAST:  100mL ISOVUE-300 IOPAMIDOL (ISOVUE-300) INJECTION 61% COMPARISON:  None. FINDINGS: Lower chest:  No significant abnormality Hepatobiliary: There are normal appearances of the liver, gallbladder and bile ducts. Pancreas: Normal Spleen: Normal Adrenals/Urinary Tract: The adrenals and kidneys are normal in appearance. There is no urinary calculus evident. There is no hydronephrosis or ureteral dilatation. Collecting systems and ureters appear unremarkable. Stomach/Bowel: The stomach, small bowel and colon are unremarkable. The appendix is not conclusively visible but there is no evidence of appendicitis. Vascular/Lymphatic: The abdominal aorta is normal in caliber. There is no atherosclerotic calcification. There is no adenopathy in the abdomen or pelvis. Reproductive: Unremarkable uterus. The right ovary appears moderately large and there is a small-moderate volume fluid in the right adnexal  region. Left ovary is unremarkable. Other: No focal inflammatory changes are evident in the abdomen or pelvis. Musculoskeletal: No significant skeletal lesion. IMPRESSION: Prominent right ovary and small to moderate volume right adnexal fluid. Pelvic sonography may provide better characterization if additional imaging is clinically warranted. Otherwise unremarkable. Electronically Signed   By: Ellery Plunkaniel R Mitchell M.D.   On: 10/19/2015 21:45   I have personally reviewed and evaluated these images and lab results as part of my medical decision-making.   EKG Interpretation None      MDM  Pt's feeling much better.  She knows to return if worse.  I will give her a dose of diflucan here and d/c her with lortab and zofran.  Final diagnoses:  Cyst of right ovary  Candidiasis of vagina   I personally performed the services described in this documentation, which was scribed in my presence. The recorded information has been reviewed and is accurate.    Jacalyn LefevreJulie Taeveon Keesling, MD 10/19/15 2235

## 2015-10-19 NOTE — ED Provider Notes (Signed)
CSN: [id number]     Arrival date & time 10/19/15  1832 History   First MD Initiated Contact with Patient 10/19/15 1852     Chief Complaint  Patient presents with  . Abdominal Pain  . Nausea  . Emesis     HPI Comments: Patient and family ate dinner about 1.5 hours ago.  Afterwards, while driving about one hour ago, patient developed sudden sharp lower and mid-abdominal pain, followed immediately by three episodes of vomiting.  Upon arriving home she noticed that her menses had started.  She has occasionally had mild menstrual cramps in the past, but never severe pain as she presently has.  Her nausea has ceased, but her non-radiating pain persists.  No urinary symptoms.  No recent change in bowel movements.  She has had sweats this evening but no fever.  She notes that her pain is  Intensified with any movement, and when driving over uneven pavement.  Patient is a 25 y.o. female presenting with abdominal pain. The history is provided by the patient and a parent.  Abdominal Pain Pain location:  Generalized Pain quality: sharp and stabbing   Pain quality: not cramping   Pain radiates to:  Does not radiate Pain severity:  Severe Onset quality:  Sudden Duration:  1 hour Timing:  Constant Progression:  Unchanged Chronicity:  New Context: eating   Context: not alcohol use, not previous surgeries, not recent illness, not recent travel, not sick contacts, not suspicious food intake and not trauma   Relieved by:  None tried Worsened by:  Movement, palpation and position changes Ineffective treatments:  None tried Associated symptoms: anorexia, chills, fatigue, nausea and vomiting   Associated symptoms: no chest pain, no constipation, no cough, no diarrhea, no dysuria, no fever, no hematemesis, no hematochezia, no hematuria, no melena, no shortness of breath and no vaginal discharge     Past Medical History  Diagnosis Date  . Epilepsy (HCC)   . Allergic rhinitis   . Epilepsy [facility](HCC)    Past  Surgical History  Procedure Laterality Date  . Hand surgery Left   . Eye surgery Left    Family History  Problem Relation Age of Onset  . Hyperlipidemia Mother   . Hypertension Father   . Diabetes Father    Social History  Substance Use Topics  . Smoking status: Never Smoker   . Smokeless tobacco: None  . Alcohol Use: No   OB History    No data available     Review of Systems  Constitutional: Positive for chills and fatigue. Negative for fever.  HENT: Negative.   Eyes: Negative.   Respiratory: Negative for cough and shortness of breath.   Cardiovascular: Negative for chest pain.  Gastrointestinal: Positive for nausea, vomiting, abdominal pain and anorexia. Negative for diarrhea, constipation, melena, hematochezia and hematemesis.  Genitourinary: Positive for pelvic pain. Negative for dysuria, urgency, frequency, hematuria, flank pain, vaginal discharge and menstrual problem.    Allergies  Review of patient's allergies indicates no known allergies.  Home Medications   Prior to Admission medications   Medication Sig Start Date End Date Taking? Authorizing Provider  AMITRIPTYLINE HCL PO Take by mouth.    Historical Provider, MD  lamoTRIgine (LAMICTAL) 150 MG tablet Take 150 mg by mouth daily.    Historical Provider, MD   Meds Ordered and Administered this Visit  Medications - No data to display  BP 110/71 mmHg  Pulse 88  Temp(Src) 97.7 F (36.5 C) (Oral)  Resp 16  Ht 5\' 4"  (1.626 m)  Wt 120 lb (54.432 kg)  BMI 20.59 kg/m2  SpO2 100%  LMP 10/19/2015 (Exact Date) No data found.   Physical Exam  Constitutional: She is oriented to person, place, and time. She appears well-developed and well-nourished. She appears distressed.  HENT:  Head: Normocephalic.  Nose: Nose normal.  Mouth/Throat: Oropharynx is clear and moist.  Eyes: Conjunctivae are normal. Pupils are equal, round, and reactive to light.  Neck: Neck supple.  Cardiovascular: Normal heart sounds.     Pulmonary/Chest: Breath sounds normal.  Abdominal: She exhibits no distension and no mass. Bowel sounds are decreased. There is generalized tenderness. There is rebound and guarding. There is no CVA tenderness.    Musculoskeletal: She exhibits edema.  Lymphadenopathy:    She has no cervical adenopathy.  Neurological: She is alert and oriented to person, place, and time.  Skin: Skin is warm and dry. No rash noted. She is not diaphoretic.  Nursing note and vitals reviewed.   ED Course  Procedures    MDM   1. Generalized abdominal pain ?etiology    Recommend hospital emergency department evaluation and treatment.  Patient's vital signs stable; safe to travel by private vehicle.    Lattie HawStephen A Beese, MD 10/19/15 [phone]

## 2015-10-19 NOTE — ED Notes (Signed)
[facility] for sudden onset of Abdominal pain. The patient was sent here for further evaluation

## 2015-10-23 LAB — GC/CHLAMYDIA PROBE AMP (~~LOC~~) NOT AT ARMC
CHLAMYDIA, DNA PROBE: NEGATIVE
Neisseria Gonorrhea: NEGATIVE

## 2016-11-18 IMAGING — CT CT ABD-PELV W/ CM
2 of 4 series · 16 of 46 positions shown, 18 images · IV contrast (APPLIED)
Comparison: None.

CLINICAL DATA: Sudden onset of abdominal pain, postprandial.

EXAM:
CT ABDOMEN AND PELVIS WITH CONTRAST
TECHNIQUE: Multidetector CT imaging of the abdomen and pelvis was performed
using the standard protocol following bolus administration of
intravenous contrast.
CONTRAST:  100mL BLWGI0-O00 IOPAMIDOL (BLWGI0-O00) INJECTION 61%

[Series 2: axial st · axial · 0.79mm/px · z∈[-471,-91]mm · 13 of 84 slices shown, 15 images]
[im 4/84  soft-tissue]
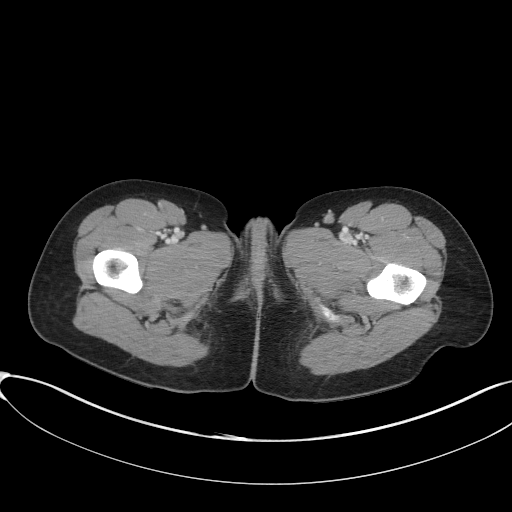
[im 4/84  bone]
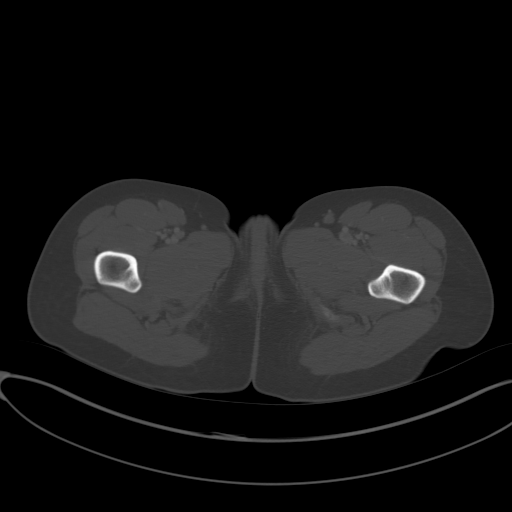
[im 10/84  soft-tissue]
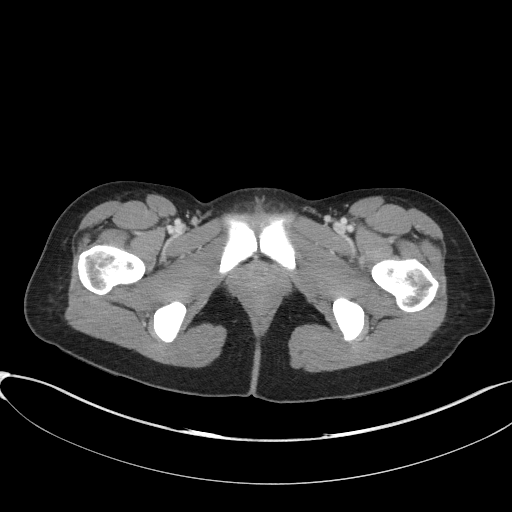
[im 17/84  soft-tissue]
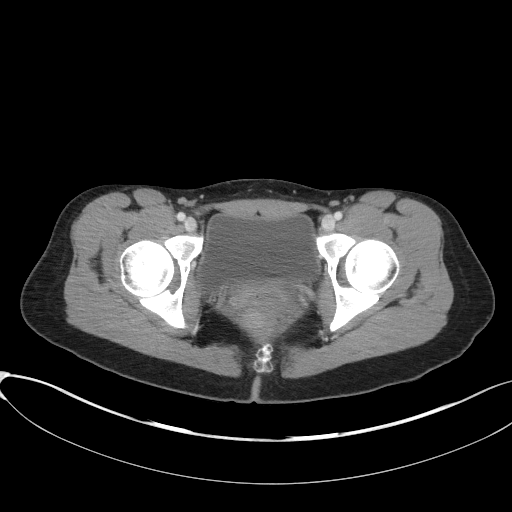
[im 24/84  soft-tissue]
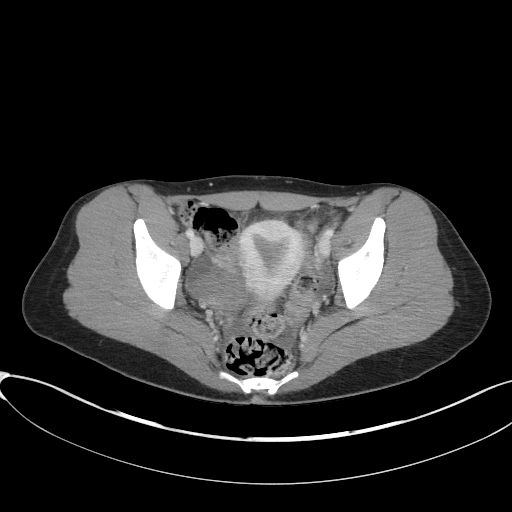
[im 30/84  soft-tissue]
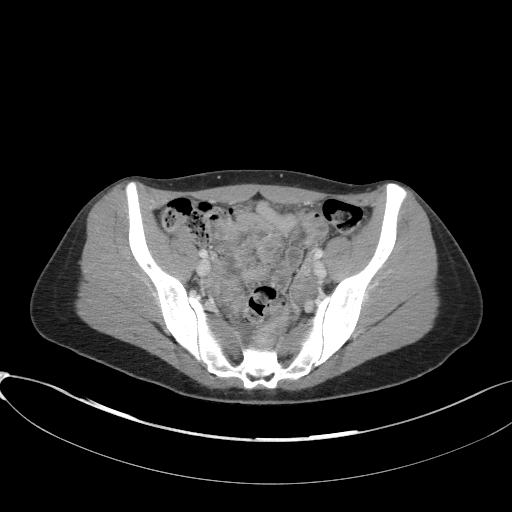
[im 37/84  soft-tissue]
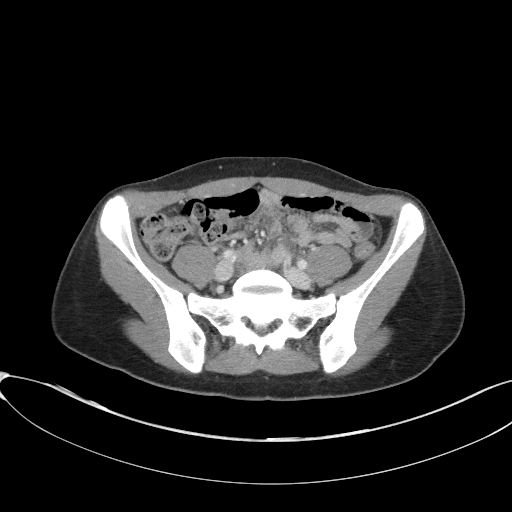
[im 44/84  soft-tissue]
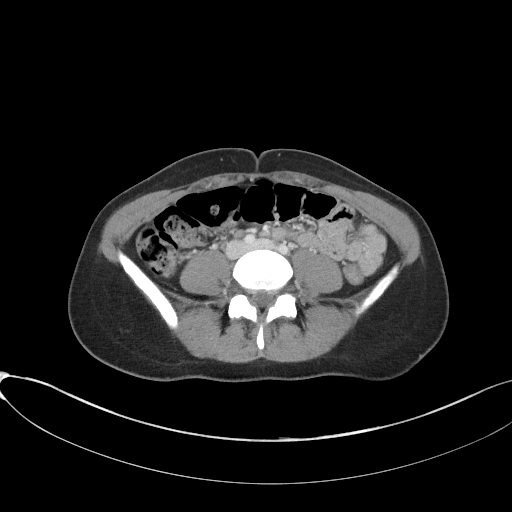
[im 47/84  soft-tissue]
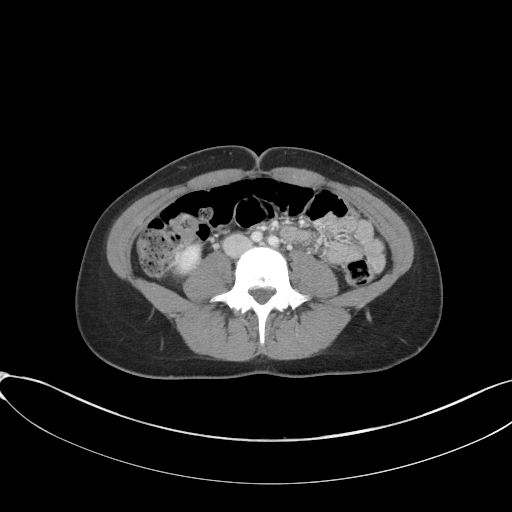
[im 54/84  soft-tissue]
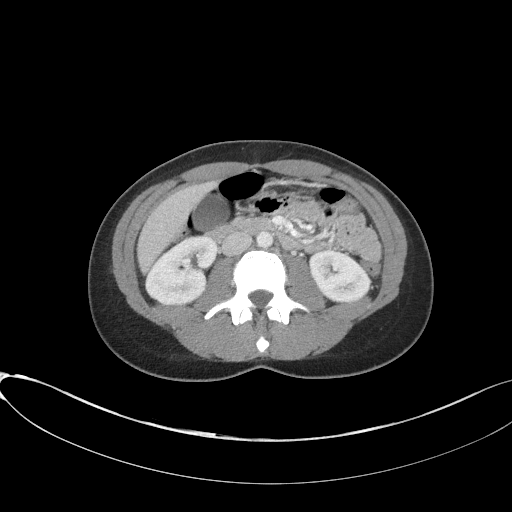
[im 54/84  bone]
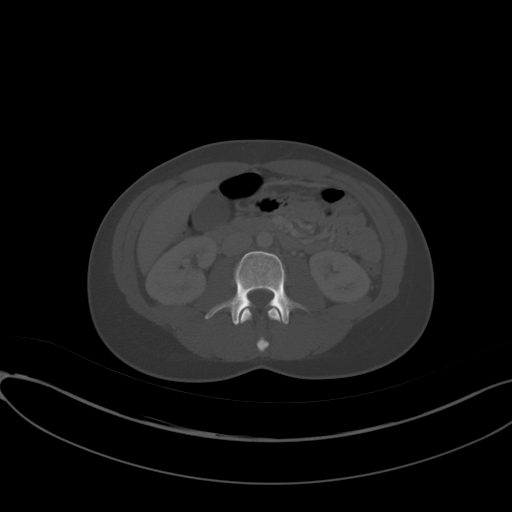
[im 60/84  soft-tissue]
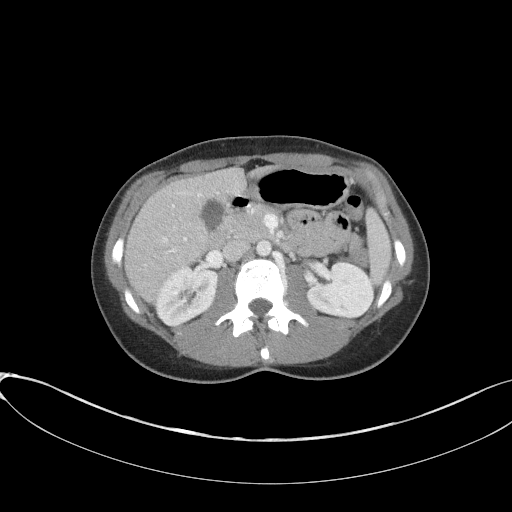
[im 67/84  soft-tissue]
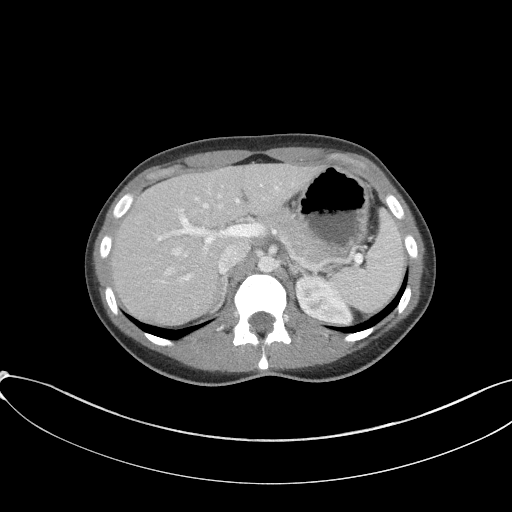
[im 74/84  soft-tissue]
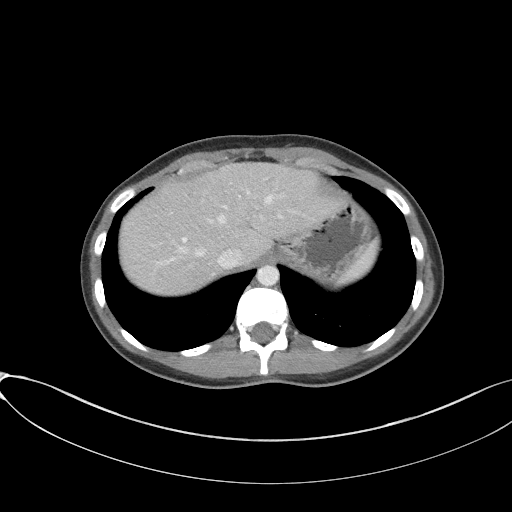
[im 80/84  soft-tissue]
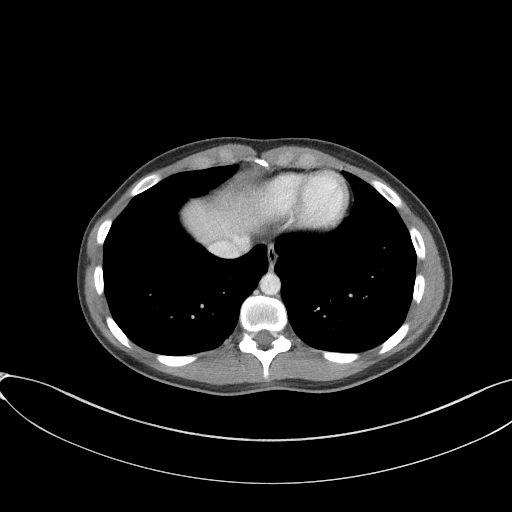

[Series 5: coronal st · coronal · 0.79mm/px · 3 of 70 slices shown]
[im 24/70  soft-tissue]
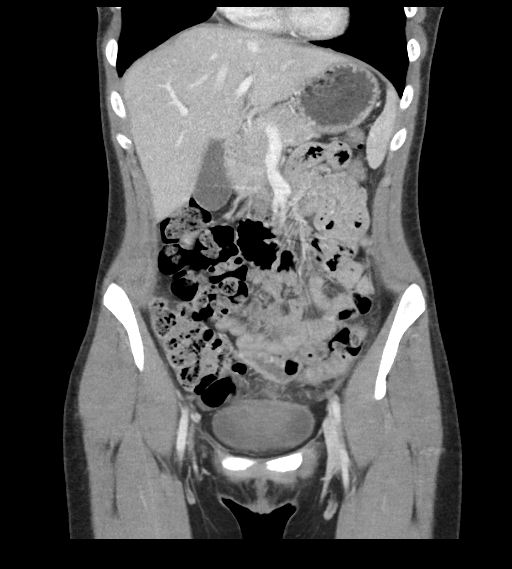
[im 31/70  soft-tissue]
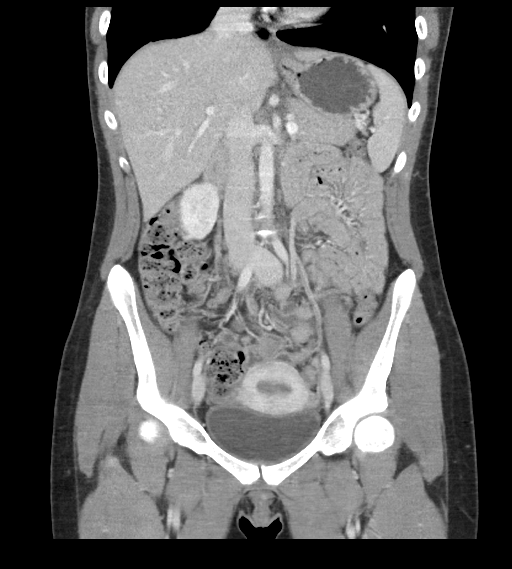
[im 39/70  soft-tissue]
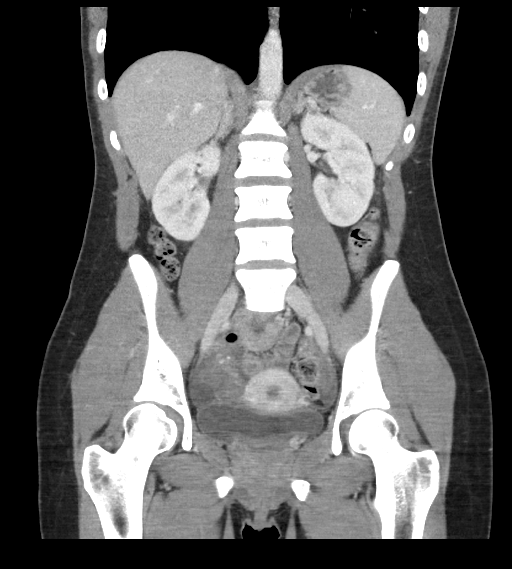

[16 of 46 positions shown; findings below may reference images not displayed]

FINDINGS: Lower chest:  No significant abnormality

Hepatobiliary: There are normal appearances of the liver,
gallbladder and bile ducts.

Pancreas: Normal

Spleen: Normal

Adrenals/Urinary Tract: The adrenals and kidneys are normal in
appearance. There is no urinary calculus evident. There is no
hydronephrosis or ureteral dilatation. Collecting systems and
ureters appear unremarkable.

Stomach/Bowel: The stomach, small bowel and colon are unremarkable.
The appendix is not conclusively visible but there is no evidence of
appendicitis.

Vascular/Lymphatic: The abdominal aorta is normal in caliber. There
is no atherosclerotic calcification. There is no adenopathy in the
abdomen or pelvis.

Reproductive: Unremarkable uterus. The right ovary appears
moderately large and there is a small-moderate volume fluid in the
right adnexal region. Left ovary is unremarkable.

Other: No focal inflammatory changes are evident in the abdomen or
pelvis.

Musculoskeletal: No significant skeletal lesion.
IMPRESSION: Prominent right ovary and small to moderate volume right adnexal
fluid. Pelvic sonography may provide better characterization if
additional imaging is clinically warranted. Otherwise unremarkable.

## 2016-11-26 ENCOUNTER — Emergency Department (INDEPENDENT_AMBULATORY_CARE_PROVIDER_SITE_OTHER): Payer: BLUE CROSS/BLUE SHIELD

## 2016-11-26 ENCOUNTER — Encounter: Payer: Self-pay | Admitting: *Deleted

## 2016-11-26 ENCOUNTER — Emergency Department (INDEPENDENT_AMBULATORY_CARE_PROVIDER_SITE_OTHER)
Admission: EM | Admit: 2016-11-26 | Discharge: 2016-11-26 | Disposition: A | Discharge status: Home / Self Care | Payer: BLUE CROSS/BLUE SHIELD | Source: Home / Self Care | Attending: Family Medicine | Admitting: Family Medicine

## 2016-11-26 DIAGNOSIS — N83201 Unspecified ovarian cyst, right side: Secondary | ICD-10-CM

## 2016-11-26 DIAGNOSIS — R1030 Lower abdominal pain, unspecified: Secondary | ICD-10-CM | POA: Diagnosis not present

## 2016-11-26 DIAGNOSIS — R1033 Periumbilical pain: Secondary | ICD-10-CM

## 2016-11-26 DIAGNOSIS — R102 Pelvic and perineal pain: Secondary | ICD-10-CM

## 2016-11-26 HISTORY — DX: Unspecified convulsions: R56.9

## 2016-11-26 LAB — POCT CBC W AUTO DIFF (K'VILLE URGENT CARE)

## 2016-11-26 LAB — COMPLETE METABOLIC PANEL WITH GFR
ALT: 11 U/L (ref 6–29)
AST: 13 U/L (ref 10–30)
Albumin: 4.4 g/dL (ref 3.6–5.1)
Alkaline Phosphatase: 62 U/L (ref 33–115)
BUN: 10 mg/dL (ref 7–25)
CO2: 21 mmol/L (ref 20–32)
Calcium: 9 mg/dL (ref 8.6–10.2)
Chloride: 102 mmol/L (ref 98–110)
Creat: 0.8 mg/dL (ref 0.50–1.10)
GFR, Est African American: >89 mL/min (ref >=60)
GFR, Est Non African American: >89 mL/min (ref >=60)
Glucose, Bld: 85 mg/dL (ref 65–99)
Potassium: 3.7 mmol/L (ref 3.5–5.3)
Sodium: 135 mmol/L (ref 135–146)
Total Bilirubin: 0.7 mg/dL (ref 0.2–1.2)
Total Protein: 6.8 g/dL (ref 6.1–8.1)

## 2016-11-26 LAB — POCT URINALYSIS DIP (MANUAL ENTRY)
Bilirubin, UA: NEGATIVE
Glucose, UA: NEGATIVE mg/dL
Ketones, POC UA: NEGATIVE mg/dL
Leukocytes, UA: NEGATIVE
Nitrite, UA: NEGATIVE
Spec Grav, UA: 1.02 (ref 1.010–1.025)
Urobilinogen, UA: 1 E.U./dL
pH, UA: 7 (ref 5.0–8.0)

## 2016-11-26 LAB — POCT URINE PREGNANCY: Preg Test, Ur: NEGATIVE

## 2016-11-26 LAB — HCG, QUANTITATIVE, PREGNANCY: hCG, Beta Chain, Quant, S: <2 m[IU]/mL

## 2016-11-26 MED ORDER — HYDROCODONE-ACETAMINOPHEN 5-325 MG PO TABS: 1.0000 | ORAL_TABLET | Freq: Four times a day (QID) | ORAL | 0 refills | Status: DC | PRN

## 2016-11-26 MED ORDER — NAPROXEN 500 MG PO TABS: 500.0000 mg | ORAL_TABLET | Freq: Two times a day (BID) | ORAL | 0 refills | Status: DC

## 2016-11-26 NOTE — ED Provider Notes (Signed)
CSN: [id number]     Arrival date & time 11/26/16  0914 History   First MD Initiated Contact with Patient 11/26/16 [phone]     Chief Complaint  Patient presents with  . Abdominal Pain   (Consider location/radiation/quality/duration/timing/severity/associated sxs/prior Treatment) HPI  Megan Mueller is a 26 y.o. female presenting to UC with c/o 2 days of gradually worsening abdominal pain in umbilical region. Pain is aching and sharp.  Worse with certain movements.  She started her period yesterday and does report hx of ovarian cysts.  She has tried Midol and applied a heating pad with moderate temporary relief. Pt took 1 Norco today with moderate relief but pain is now worsening again.  Denies fever, chills, n/v/d.  Denies dysuria.   Per medical records, pt had very similar symptoms in June 2017. Was evaluated in ED at that time, had CT abd performed. Findings were c/w ovarian cyst.  She does have an OB/GYN but has not mentioned current pain. States she cannot be on hormonal birth control due to hx of seizures. Denies concern for STIs. No vaginal symptoms.  No hx of abdominal surgeries.    Past Medical History:  Diagnosis Date  . Allergic rhinitis   . Epilepsy (HCC)   . Epilepsy (HCC)   . Seizures (HCC)    Past Surgical History:  Procedure Laterality Date  . EYE SURGERY Left   . HAND SURGERY Left    Family History  Problem Relation Age of Onset  . Hyperlipidemia Mother   . Hypertension Father   . Diabetes Father    Social History  Substance Use Topics  . Smoking status: Never Smoker  . Smokeless tobacco: Never Used  . Alcohol use No   OB History    No data available     Review of Systems  Constitutional: Negative for chills and fever.  Gastrointestinal: Positive for abdominal pain. Negative for diarrhea, nausea and vomiting.  Genitourinary: Positive for pelvic pain and vaginal bleeding (due to menses ). Negative for dysuria, flank pain, frequency, hematuria, urgency, vaginal  discharge and vaginal pain.  Musculoskeletal: Negative for back pain and myalgias.    Allergies  Patient has no known allergies.  Home Medications   Prior to Admission medications   Medication Sig Start Date End Date Taking? Authorizing Provider  AMITRIPTYLINE HCL PO Take by mouth.   Yes [provider]  lamoTRIgine (LAMICTAL) 150 MG tablet Take 150 mg by mouth daily.   Yes [provider]  zonisamide (ZONEGRAN) 100 MG capsule Take 100 mg by mouth daily.   Yes [provider]  HYDROcodone-acetaminophen (NORCO/VICODIN) 5-325 MG tablet Take 1-2 tablets by mouth every 6 (six) hours as needed for moderate pain or severe pain. 11/26/16   Lurene Shadow, PA-C  naproxen (NAPROSYN) 500 MG tablet Take 1 tablet (500 mg total) by mouth 2 (two) times daily. 11/26/16   Lurene Shadow, PA-C  ondansetron (ZOFRAN) 4 MG tablet Take 1 tablet (4 mg total) by mouth every 6 (six) hours. 10/19/15   Jacalyn Lefevre, MD   Meds Ordered and Administered this Visit  Medications - No data to display  BP 103/65 (BP Location: Left Arm)   Pulse 85   Temp 98.2 F (36.8 C) (Oral)   Resp 16   Ht 5\' 5"  (1.651 m)   Wt 124 lb (56.2 kg)   LMP 11/26/2016   SpO2 100%   BMI 20.63 kg/m  No data found.   Physical Exam  Constitutional: She is  oriented to person, place, and time. She appears well-developed and well-nourished. No distress.  HENT:  Head: Normocephalic and atraumatic.  Eyes: EOM are normal.  Neck: Normal range of motion.  Cardiovascular: Normal rate and regular rhythm.   Pulmonary/Chest: Effort normal. No respiratory distress. She has no wheezes. She has no rales.  Abdominal: Soft. Bowel sounds are normal. She exhibits no distension and no mass. There is tenderness. There is no rebound, no guarding and no CVA tenderness.    Musculoskeletal: Normal range of motion.  Neurological: She is alert and oriented to person, place, and time.  Skin: Skin is warm and dry. She is not  diaphoretic.  Psychiatric: She has a normal mood and affect. Her behavior is normal.  Nursing note and vitals reviewed.   Urgent Care Course     Procedures (including critical care time)  Labs Review Labs Reviewed  POCT URINALYSIS DIP (MANUAL ENTRY) - Abnormal; Notable for the following:       Result Value   Blood, UA moderate (*)    Protein Ur, POC trace (*)    All other components within normal limits  COMPLETE METABOLIC PANEL WITH GFR  HCG, QUANTITATIVE, PREGNANCY  POCT CBC W AUTO DIFF ([facility])  POCT URINE PREGNANCY    Imaging Review [place] Transvaginal Non-ob  Result Date: 11/26/2016 CLINICAL DATA:  26 year old female with mid to lower abdominal and pelvic pain for 2 days. LMP yesterday. EXAM: TRANSABDOMINAL AND TRANSVAGINAL ULTRASOUND OF PELVIS TECHNIQUE: Both transabdominal and transvaginal ultrasound examinations of the pelvis were performed. Transabdominal technique was performed for global imaging of the pelvis including uterus, ovaries, adnexal regions, and pelvic cul-de-sac. It was necessary to proceed with endovaginal exam following the transabdominal exam to visualize the ovaries. COMPARISON:  CT Abdomen and Pelvis 10/19/2015. FINDINGS: Uterus Measurements: 7.2 x 4.4 x 5.3 cm. No fibroids or other mass visualized. Endometrium Thickness: 16 mm.  No focal abnormality visualized. Right ovary Measurements: Enlarged, 5.6 x 3.4 x 5.2 cm. Rounded isoechoic to slightly hyperechoic to background parenchyma mass with some internal vascularity measuring 4 cm (images 48 through 59). The remaining right ovarian parenchyma appears normal with occasional small follicles. Left ovary Measurements: 2.9 x 1.8 x 2.0 cm. Normal appearance/no adnexal mass. Other findings Trace simple appearing pelvic free fluid in the cul-de-sac. IMPRESSION: 1. Solid versus complex cystic 4 cm mass-like area within the right ovary. Recommend quantitative beta HCG to exclude the possibility of ectopic  pregnancy, and assuming that is negative a follow-up Pelvis MRI (gyn protocol without and with contrast) to further characterize. 2. Normal left ovary and uterus. Trace pelvic free fluid is likely physiologic. Electronically Signed   By: Odessa FlemingH  Hall M.D.   On: 11/26/2016 11:56   [place] Pelvis Complete  Result Date: 11/26/2016 CLINICAL DATA:  26 year old female with mid to lower abdominal and pelvic pain for 2 days. LMP yesterday. EXAM: TRANSABDOMINAL AND TRANSVAGINAL ULTRASOUND OF PELVIS TECHNIQUE: Both transabdominal and transvaginal ultrasound examinations of the pelvis were performed. Transabdominal technique was performed for global imaging of the pelvis including uterus, ovaries, adnexal regions, and pelvic cul-de-sac. It was necessary to proceed with endovaginal exam following the transabdominal exam to visualize the ovaries. COMPARISON:  CT Abdomen and Pelvis 10/19/2015. FINDINGS: Uterus Measurements: 7.2 x 4.4 x 5.3 cm. No fibroids or other mass visualized. Endometrium Thickness: 16 mm.  No focal abnormality visualized. Right ovary Measurements: Enlarged, 5.6 x 3.4 x 5.2 cm. Rounded isoechoic to slightly hyperechoic to background parenchyma mass with some internal  vascularity measuring 4 cm (images 48 through 59). The remaining right ovarian parenchyma appears normal with occasional small follicles. Left ovary Measurements: 2.9 x 1.8 x 2.0 cm. Normal appearance/no adnexal mass. Other findings Trace simple appearing pelvic free fluid in the cul-de-sac. IMPRESSION: 1. Solid versus complex cystic 4 cm mass-like area within the right ovary. Recommend quantitative beta HCG to exclude the possibility of ectopic pregnancy, and assuming that is negative a follow-up Pelvis MRI (gyn protocol without and with contrast) to further characterize. 2. Normal left ovary and uterus. Trace pelvic free fluid is likely physiologic. Electronically Signed   By: Odessa Fleming M.D.   On: 11/26/2016 11:56    MDM   1. Periumbilical  abdominal pain   2. Pelvic pain   3. Cyst of right ovary    Hx and exam c/w complex cystic 4cm mass-like area within Right ovary.   UA: unremarkable Urine preg: Negative Per radiologist report, recommend beta HCG quant ordered.   CBC: unremarkable CMP and beta HCG quant sent to lab  If beta HCG negative (likely), recommend f/u with OB/GYN for likely Pelvic MRI for further evaluation of mass.  Pt agreeable   Rx: Norco and Naproxen  [place] Controlled Substance Database Reviewed. Norco appropriate for short-term treatment of acute abdominal pain. Advised not to drink alcohol or drive while taking. Pt agreeable.     Lurene Shadow, PA-C 11/26/16 1320

## 2016-11-26 NOTE — ED Notes (Signed)
Blood drawn from RT antecubital area. Attempted 1 stick. Pt tolerated procedure well. Clemens Catholichristy Reco Shonk, LPN

## 2016-11-26 NOTE — ED Triage Notes (Signed)
Pt c/o umbilical area pain x 2 days. Reports a sudden onset, sharp pain. She has a hx of ovarian cyst. She took Midol and applied a heating pad at onset with some relief. She took a Hydrocodone 5/325mg  at 0730 today with some relief, but the pain is now returning. Reports Nml BM. Denies dysuria or fever.

## 2016-11-26 NOTE — Discharge Instructions (Signed)
°  Norco/Vicodin (hydrocodone-acetaminophen) is a narcotic pain medication, do not combine these medications with others containing tylenol. While taking, do not drink alcohol, drive, or perform any other activities that requires focus while taking these medications.  ° °

## 2016-11-27 ENCOUNTER — Telehealth: Payer: Self-pay | Admitting: Emergency Medicine

## 2016-11-27 NOTE — Telephone Encounter (Signed)
Labs are negative,HCG levels very low showing possibly less than one week pregnant, needs to be retested to confirm.

## 2017-03-10 ENCOUNTER — Other Ambulatory Visit: Payer: Self-pay

## 2017-03-10 ENCOUNTER — Emergency Department (INDEPENDENT_AMBULATORY_CARE_PROVIDER_SITE_OTHER)
Admission: EM | Admit: 2017-03-10 | Discharge: 2017-03-10 | Disposition: A | Discharge status: Home / Self Care | Payer: Self-pay | Source: Home / Self Care | Attending: Family Medicine | Admitting: Family Medicine

## 2017-03-10 ENCOUNTER — Encounter: Payer: Self-pay | Admitting: *Deleted

## 2017-03-10 DIAGNOSIS — R112 Nausea with vomiting, unspecified: Secondary | ICD-10-CM

## 2017-03-10 DIAGNOSIS — R197 Diarrhea, unspecified: Secondary | ICD-10-CM

## 2017-03-10 MED ORDER — ONDANSETRON HCL 8 MG PO TABS
8.0000 mg | ORAL_TABLET | Freq: Once | ORAL | Status: AC
  Administered 2017-03-10: 8 mg via ORAL

## 2017-03-10 MED ORDER — ONDANSETRON HCL 4 MG PO TABS: 4.0000 mg | ORAL_TABLET | Freq: Three times a day (TID) | ORAL | 0 refills | Status: DC | PRN

## 2017-03-10 NOTE — ED Triage Notes (Signed)
Pt c/o nausea last night with vomit 5x today since 0430 and one episode of diarrhea today at 1530. Denies fever or abd pain. She took Zofran at 0700 today.

## 2017-03-10 NOTE — ED Provider Notes (Signed)
[facility]    CSN: [id number] Arrival date & time: 03/10/17  1435     History   Chief Complaint Chief Complaint  Patient presents with  . Emesis    HPI Megan Mueller is a 26 y.o. female.   Patient complained of onset of nausea at about 11pm last night. She developed diarrhea during the night, and vomited at 4am.  She has been fatigued with chills.  No abdominal pain.  She has vomited five times today.  She notes decreased vomiting after taking Zofran at 0700 today.   The history is provided by the patient.  Emesis  Severity:  Moderate Duration:  11 hours Timing:  Intermittent Number of daily episodes:  5 Quality:  Stomach contents Able to tolerate:  Liquids Progression:  Improving Chronicity:  New Recent urination:  Decreased Relieved by:  Antiemetics Worsened by:  Nothing Ineffective treatments:  None tried Associated symptoms: chills and diarrhea   Associated symptoms: no abdominal pain, no arthralgias, no cough, no fever, no headaches, no myalgias, no sore throat and no URI   Risk factors: no sick contacts, no suspect food intake and no travel to endemic areas     Past Medical History:  Diagnosis Date  . Allergic rhinitis   . Epilepsy (HCC)   . Epilepsy (HCC)   . Seizures [facility](HCC)     Patient Active Problem List   Diagnosis Date Noted  . Annual physical exam 09/01/2015  . Seizure disorder, grand mal (HCC) 09/01/2015    Past Surgical History:  Procedure Laterality Date  . EYE SURGERY Left   . HAND SURGERY Left     OB History    No data available       Home Medications    Prior to Admission medications   Medication Sig Start Date End Date Taking? Authorizing Provider  lamoTRIgine (LAMICTAL) 150 MG tablet Take 150 mg by mouth daily.   Yes [provider]  zonisamide (ZONEGRAN) 100 MG capsule Take 100 mg by mouth daily.   Yes [provider]  AMITRIPTYLINE HCL PO Take by mouth.    [provider]    HYDROcodone-acetaminophen (NORCO/VICODIN) 5-325 MG tablet Take 1-2 tablets by mouth every 6 (six) hours as needed for moderate pain or severe pain. 11/26/16   Lurene ShadowPhelps, Erin O, PA-C  naproxen (NAPROSYN) 500 MG tablet Take 1 tablet (500 mg total) by mouth 2 (two) times daily. 11/26/16   Lurene ShadowPhelps, Erin O, PA-C  ondansetron (ZOFRAN) 4 MG tablet Take 1 tablet (4 mg total) every 8 (eight) hours as needed by mouth for nausea or vomiting. 03/10/17   Lattie HawBeese, Stephen A, MD    Family History Family History  Problem Relation Age of Onset  . Hyperlipidemia Mother   . Hypertension Father   . Diabetes Father     Social History Social History   Tobacco Use  . Smoking status: Never Smoker  . Smokeless tobacco: Never Used  Substance Use Topics  . Alcohol use: No  . Drug use: No     Allergies   Patient has no known allergies.   Review of Systems Review of Systems  Constitutional: Positive for chills. Negative for fever.  HENT: Negative for sore throat.   Respiratory: Negative for cough.   Gastrointestinal: Positive for diarrhea and vomiting. Negative for abdominal pain.  Musculoskeletal: Negative for arthralgias and myalgias.  Neurological: Negative for headaches.  All other systems reviewed and are negative.    Physical Exam Triage Vital Signs ED Triage  Vitals [03/10/17 1500]  Enc Vitals Group     BP 105/68     Pulse Rate 88     Resp 16     Temp 97.6 F (36.4 C)     Temp Source Oral     SpO2 97 %     Weight 118 lb (53.5 kg)     Height 5\' 4"  (1.626 m)     Head Circumference      Peak Flow      Pain Score 0     Pain Loc      Pain Edu?      Excl. in GC?    No data found.  Updated Vital Signs BP 105/68 (BP Location: Left Arm)   Pulse 88   Temp 97.6 F (36.4 C) (Oral)   Resp 16   Ht 5\' 4"  (1.626 m)   Wt 118 lb (53.5 kg)   LMP 03/10/2017   SpO2 97%   BMI 20.25 kg/m   Visual Acuity Right Eye Distance:   Left Eye Distance:   Bilateral Distance:    Right Eye Near:    Left Eye Near:    Bilateral Near:     Physical Exam Nursing notes and Vital Signs reviewed. Appearance:  Patient appears stated age, and in no acute distress Eyes:  Pupils are equal, round, and reactive to light and accomodation.  Extraocular movement is intact.  Conjunctivae are not inflamed  Ears:  Canals normal.  Tympanic membranes normal.  Nose:  Normal turbinates.  No sinus tenderness.   Pharynx:  Normal; moist mucous membranes  Neck:  Supple.  No adenopathy.   Lungs:  Clear to auscultation.  Breath sounds are equal.  Moving air well. Heart:  Regular rate and rhythm without murmurs, rubs, or gallops.  Abdomen:  Nontender without masses or hepatosplenomegaly.  Bowel sounds are present and increased.  No CVA or flank tenderness.  Extremities:  No edema.  Skin:  No rash present.    UC Treatments / Results  Labs (all labs ordered are listed, but only abnormal results are displayed) Labs Reviewed - No data to display  EKG  EKG Interpretation None       Radiology No results found.  Procedures Procedures (including critical care time)  Medications Ordered in UC Medications  ondansetron (ZOFRAN) tablet 8 mg (8 mg Oral Given 03/10/17 1517)     Initial Impression / Assessment and Plan / UC Course  I have reviewed the triage vital signs and the nursing notes.  Pertinent labs & imaging results that were available during my care of the patient were reviewed by me and considered in my medical decision making (see chart for details).    Suspect viral gastroenteritis. Administered Zofran ODT 4mg  PO  Rx given for Zofran ODT 4mg . Begin clear liquids (Pedialyte while having diarrhea) until improved, then advance to a [employer] (Bananas, Rice, Applesauce, Toast).  Then gradually resume a regular diet when tolerated.  Avoid milk products until well.    If symptoms become significantly worse during the night or over the weekend, proceed to the local emergency room.  Followup with  Family Doctor if not improved in two days.    Final Clinical Impressions(s) / UC Diagnoses   Final diagnoses:  Nausea vomiting and diarrhea    ED Discharge Orders        Ordered    ondansetron (ZOFRAN) 4 MG tablet  Every 8 hours PRN     03/10/17 1518  Lattie HawBeese, Stephen A, MD 03/13/17 1159

## 2017-03-10 NOTE — Discharge Instructions (Signed)
Begin clear liquids (Pedialyte while having diarrhea) until improved, then advance to a BRAT diet (Bananas, Rice, Applesauce, Toast).  Then gradually resume a regular diet when tolerated.  Avoid milk products until well.  ° °If symptoms become significantly worse during the night or over the weekend, proceed to the local emergency room. ° °

## 2017-04-08 ENCOUNTER — Emergency Department (INDEPENDENT_AMBULATORY_CARE_PROVIDER_SITE_OTHER)
Admission: EM | Admit: 2017-04-08 | Discharge: 2017-04-08 | Disposition: A | Discharge status: Home / Self Care | Payer: Self-pay | Source: Home / Self Care | Attending: Family Medicine | Admitting: Family Medicine

## 2017-04-08 ENCOUNTER — Other Ambulatory Visit: Payer: Self-pay

## 2017-04-08 ENCOUNTER — Encounter: Payer: Self-pay | Admitting: *Deleted

## 2017-04-08 DIAGNOSIS — R1031 Right lower quadrant pain: Secondary | ICD-10-CM

## 2017-04-08 MED ORDER — ONDANSETRON 4 MG PO TBDP
4.0000 mg | ORAL_TABLET | Freq: Once | ORAL | Status: AC
  Administered 2017-04-08: 4 mg via ORAL

## 2017-04-08 NOTE — ED Provider Notes (Signed)
[facility]    CSN: [id number] Arrival date & time: 04/08/17  1953     History   Chief Complaint Chief Complaint  Patient presents with  . Abdominal Cramping  . Nausea    HPI Megan Mueller is a 26 y.o. female.   HPI Megan Mueller is a 26 y.o. female presenting to UC with c/o sudden onset, gradually worsening RLQ abdominal pain that is radiating across lower abdomen.  Associated nausea and sweating that started around 6PM while she was at work. Hx of dermoid cyst on Right ovary. She took Vicodin at onset w/o relief. Denies vomiting or diarrhea. She has been able to keep down fluids.   Past Medical History:  Diagnosis Date  . Allergic rhinitis   . Epilepsy (HCC)   . Epilepsy (HCC)   . Seizures [facility](HCC)     Patient Active Problem List   Diagnosis Date Noted  . Annual physical exam 09/01/2015  . Seizure disorder, grand mal (HCC) 09/01/2015    Past Surgical History:  Procedure Laterality Date  . EYE SURGERY Left   . HAND SURGERY Left     OB History    No data available       Home Medications    Prior to Admission medications   Medication Sig Start Date End Date Taking? Authorizing Provider  AMITRIPTYLINE HCL PO Take by mouth.   Yes [provider]  HYDROcodone-acetaminophen (NORCO/VICODIN) 5-325 MG tablet Take 1-2 tablets by mouth every 6 (six) hours as needed for moderate pain or severe pain. 11/26/16  Yes Lurene ShadowPhelps, Kyleah Pensabene O, PA-C  lamoTRIgine (LAMICTAL) 150 MG tablet Take 150 mg by mouth daily.   Yes [provider]  naproxen (NAPROSYN) 500 MG tablet Take 1 tablet (500 mg total) by mouth 2 (two) times daily. 11/26/16   Lurene ShadowPhelps, Loriann Bosserman O, PA-C  ondansetron (ZOFRAN) 4 MG tablet Take 1 tablet (4 mg total) every 8 (eight) hours as needed by mouth for nausea or vomiting. 03/10/17   Lattie HawBeese, Stephen A, MD  zonisamide (ZONEGRAN) 100 MG capsule Take 100 mg by mouth daily.    [provider]    Family History Family History  Problem  Relation Age of Onset  . Hyperlipidemia Mother   . Hypertension Father   . Diabetes Father     Social History Social History   Tobacco Use  . Smoking status: Never Smoker  . Smokeless tobacco: Never Used  Substance Use Topics  . Alcohol use: No  . Drug use: No     Allergies   Patient has no known allergies.   Review of Systems Review of Systems  Constitutional: Positive for chills and diaphoresis. Negative for fever.  Gastrointestinal: Positive for abdominal pain and nausea. Negative for blood in stool, diarrhea and vomiting.  Genitourinary: Negative for dysuria, flank pain, frequency and hematuria.  Musculoskeletal: Negative for back pain and myalgias.     Physical Exam Triage Vital Signs ED Triage Vitals  Enc Vitals Group     BP 04/08/17 2006 104/65     Pulse Rate 04/08/17 2006 90     Resp 04/08/17 2006 16     Temp 04/08/17 2006 (!) 97.5 F (36.4 C)     Temp Source 04/08/17 2006 Oral     SpO2 04/08/17 2006 100 %     Weight 04/08/17 2007 120 lb (54.4 kg)     Height --      Head Circumference --      Peak Flow --  Pain Score 04/08/17 2007 10     Pain Loc --      Pain Edu? --      Excl. in GC? --    No data found.  Updated Vital Signs BP 104/65 (BP Location: Left Arm)   Pulse 90   Temp (!) 97.5 F (36.4 C) (Oral)   Resp 16   Wt 120 lb (54.4 kg)   LMP 03/10/2017   SpO2 100%   BMI 20.60 kg/m   Visual Acuity Right Eye Distance:   Left Eye Distance:   Bilateral Distance:    Right Eye Near:   Left Eye Near:    Bilateral Near:     Physical Exam  Constitutional: She is oriented to person, place, and time. She appears well-developed and well-nourished. No distress.  HENT:  Head: Normocephalic and atraumatic.  Mouth/Throat: Oropharynx is clear and moist.  Eyes: EOM are normal.  Neck: Normal range of motion.  Cardiovascular: Normal rate and regular rhythm.  Pulmonary/Chest: Effort normal and breath sounds normal. No stridor. No respiratory  distress. She has no wheezes. She has no rales.  Abdominal: Soft. She exhibits no distension and no mass. There is tenderness. There is guarding. There is no rebound and no CVA tenderness.  Musculoskeletal: Normal range of motion.  Neurological: She is alert and oriented to person, place, and time.  Skin: Skin is warm and dry. She is not diaphoretic.  Psychiatric: She has a normal mood and affect. Her behavior is normal.  Nursing note and vitals reviewed.    UC Treatments / Results  Labs (all labs ordered are listed, but only abnormal results are displayed) Labs Reviewed - No data to display  EKG  EKG Interpretation None       Radiology No results found.  Procedures Procedures (including critical care time)  Medications Ordered in UC Medications  ondansetron (ZOFRAN-ODT) disintegrating tablet 4 mg (4 mg Oral Given 04/08/17 2010)     Initial Impression / Assessment and Plan / UC Course  I have reviewed the triage vital signs and the nursing notes.  Pertinent labs & imaging results that were available during my care of the patient were reviewed by me and considered in my medical decision making (see chart for details).    Sudden onset severe RLQ No relief with Vicodin taken 2 hours ago Recommend going to ED for further evaluation of possible appendicitis or ovarian torsion.   Final Clinical Impressions(s) / UC Diagnoses   Final diagnoses:  RLQ abdominal pain    ED Discharge Orders    None       Controlled Substance Prescriptions [place] Controlled Substance Registry consulted? Not Applicable   Rolla Platehelps, Devanta Daniel O, PA-C 04/08/17 2015

## 2017-04-08 NOTE — ED Triage Notes (Signed)
Pt c/o abd cramping that began on her RLQ and now radiates across her entire lower abd with nausea and sweating that began at 1800. She reports a hx of dermoid cyst on her RT ovary. She took a Hydrocodone/ APAP at onset.

## 2017-06-27 ENCOUNTER — Ambulatory Visit: Payer: Self-pay | Admitting: Sports Medicine

## 2017-06-27 DIAGNOSIS — Z0189 Encounter for other specified special examinations: Secondary | ICD-10-CM

## 2017-08-03 ENCOUNTER — Emergency Department
Admission: EM | Admit: 2017-08-03 | Discharge: 2017-08-03 | Disposition: A | Discharge status: Home / Self Care | Payer: BLUE CROSS/BLUE SHIELD | Source: Home / Self Care | Attending: Family Medicine | Admitting: Family Medicine

## 2017-08-03 ENCOUNTER — Other Ambulatory Visit: Payer: Self-pay

## 2017-08-03 DIAGNOSIS — B370 Candidal stomatitis: Secondary | ICD-10-CM

## 2017-08-03 MED ORDER — FLUCONAZOLE 200 MG PO TABS: 200.0000 mg | ORAL_TABLET | Freq: Every day | ORAL | 0 refills | Status: DC

## 2017-08-03 NOTE — ED Provider Notes (Signed)
[name]    CSN: [id number] Arrival date & time: 08/03/17  1106     History   Chief Complaint Chief Complaint  Patient presents with  . Dysphagia    HPI Megan Mueller is a 27 y.o. female.   HPI  Megan Mueller is a 27 y.o. female presenting to UC accompanied by mother with c/o burning in her mouth when eating that started about 2 days ago.  Pt has a hx of seizures and had VNS placed on Wednesday, 07/30/17.  She had a typical seizure the next day.  She has an appointment next week to discuss having the stimulator turned on.  She called her neurologist about the burning in her mouth and was advised to discontinue her hydrocodone she was prescribed for pain from surgery.  This has not helped. She is able to drink liquids w/o any pain.  She does note there has been a white patch on her tongue since symptoms started. Denies throat swelling or trouble breathing. No hx thrush in the past. No hx of diabetes. She is no on steroids but notes one of her seizure medications causes dry mouth.     Past Medical History:  Diagnosis Date  . Allergic rhinitis   . Epilepsy (HCC)   . Epilepsy (HCC)   . Seizures [facility])     Patient Active Problem List   Diagnosis Date Noted  . Annual physical exam 09/01/2015  . Seizure disorder, grand mal (HCC) 09/01/2015    Past Surgical History:  Procedure Laterality Date  . EYE SURGERY Left   . HAND SURGERY Left     OB History   None      Home Medications    Prior to Admission medications   Medication Sig Start Date End Date Taking? Authorizing Provider  AMITRIPTYLINE HCL PO Take by mouth.    [provider]  fluconazole (DIFLUCAN) 200 MG tablet Take 1 tablet (200 mg total) by mouth daily. 08/03/17   Lurene Shadow, PA-C  HYDROcodone-acetaminophen (NORCO/VICODIN) 5-325 MG tablet Take 1-2 tablets by mouth every 6 (six) hours as needed for moderate pain or severe pain. 11/26/16   Lurene Shadow, PA-C  lamoTRIgine (LAMICTAL)  150 MG tablet Take 150 mg by mouth daily.    [provider]  naproxen (NAPROSYN) 500 MG tablet Take 1 tablet (500 mg total) by mouth 2 (two) times daily. 11/26/16   Lurene Shadow, PA-C  ondansetron (ZOFRAN) 4 MG tablet Take 1 tablet (4 mg total) every 8 (eight) hours as needed by mouth for nausea or vomiting. 03/10/17   Lattie Haw, MD  zonisamide (ZONEGRAN) 100 MG capsule Take 100 mg by mouth daily.    [provider]    Family History Family History  Problem Relation Age of Onset  . Hyperlipidemia Mother   . Hypertension Father   . Diabetes Father     Social History Social History   Tobacco Use  . Smoking status: Never Smoker  . Smokeless tobacco: Never Used  Substance Use Topics  . Alcohol use: No  . Drug use: No     Allergies   Patient has no known allergies.   Review of Systems Review of Systems  Constitutional: Negative for appetite change, chills, fatigue and fever.  HENT: Positive for mouth sores (white patch on tongue, pain). Negative for sore throat, trouble swallowing and voice change.   Gastrointestinal: Negative for nausea and vomiting.  Musculoskeletal: Positive for neck pain (mild soreness from surgical  site).  Skin: Negative for rash.     Physical Exam Triage Vital Signs ED Triage Vitals  Enc Vitals Group     BP 08/03/17 1124 111/77     Pulse Rate 08/03/17 1124 84     Resp 08/03/17 1124 18     Temp 08/03/17 1124 98.2 F (36.8 C)     Temp Source 08/03/17 1124 Oral     SpO2 08/03/17 1124 99 %     Weight 08/03/17 1125 116 lb (52.6 kg)     Height 08/03/17 1125 5\' 5"  (1.651 m)     Head Circumference --      Peak Flow --      Pain Score 08/03/17 1125 0     Pain Loc --      Pain Edu? --      Excl. in GC? --    No data found.  Updated Vital Signs BP 111/77 (BP Location: Right Arm)   Pulse 84   Temp 98.2 F (36.8 C) (Oral)   Resp 18   Ht 5\' 5"  (1.651 m)   Wt 116 lb (52.6 kg)   LMP 07/19/2017   SpO2 99%   BMI 19.30  kg/m   Visual Acuity Right Eye Distance:   Left Eye Distance:   Bilateral Distance:    Right Eye Near:   Left Eye Near:    Bilateral Near:     Physical Exam  Constitutional: She is oriented to person, place, and time. She appears well-developed and well-nourished. No distress.  HENT:  Head: Normocephalic and atraumatic.  Nose: Nose normal.  Mouth/Throat: Uvula is midline, oropharynx is clear and moist and mucous membranes are normal. Oral lesions (white patch on tongue with erythematous surrounding, mildly tender. no ulcerations) present. No posterior oropharyngeal edema, posterior oropharyngeal erythema or tonsillar abscesses.  Eyes: EOM are normal.  Neck: Normal range of motion. Neck supple.    Well healing surgical incisions. Minimally tender.   Cardiovascular: Normal rate.  Pulmonary/Chest: Effort normal.  Musculoskeletal: Normal range of motion.  Neurological: She is alert and oriented to person, place, and time.  Skin: Skin is warm and dry. She is not diaphoretic.  Psychiatric: She has a normal mood and affect. Her behavior is normal.  Nursing note and vitals reviewed.    UC Treatments / Results  Labs (all labs ordered are listed, but only abnormal results are displayed) Labs Reviewed - No data to display  EKG None Radiology No results found.  Procedures Procedures (including critical care time)  Medications Ordered in UC Medications - No data to display   Initial Impression / Assessment and Plan / UC Course  I have reviewed the triage vital signs and the nursing notes.  Pertinent labs & imaging results that were available during my care of the patient were reviewed by me and considered in my medical decision making (see chart for details).     Hx and exam c/w oral thrush Will tx with diflucan  Encouraged f/u with neurologist as previously scheduled for next week F/u with PCP as needed for oral thrush.   Final Clinical Impressions(s) / UC Diagnoses     Final diagnoses:  Oral thrush    ED Discharge Orders        Ordered    fluconazole (DIFLUCAN) 200 MG tablet  Daily     08/03/17 1134       Controlled Substance Prescriptions [place] Controlled Substance Registry consulted? Not Applicable   Rolla Platehelps, Savalas Monje O, PA-C 08/03/17 1241

## 2017-08-03 NOTE — ED Triage Notes (Signed)
Pt c/o difficulty eating since Friday. Had VNS placed on Wednesday. Starting Friday, every time she eats, she gets a burning allergic type reaction in her mouth. Can only eat eggs.

## 2017-08-15 ENCOUNTER — Emergency Department
Admission: EM | Admit: 2017-08-15 | Discharge: 2017-08-15 | Disposition: A | Discharge status: Home / Self Care | Payer: BLUE CROSS/BLUE SHIELD | Source: Home / Self Care | Attending: Family Medicine | Admitting: Family Medicine

## 2017-08-15 ENCOUNTER — Other Ambulatory Visit: Payer: Self-pay

## 2017-08-15 DIAGNOSIS — B9789 Other viral agents as the cause of diseases classified elsewhere: Secondary | ICD-10-CM | POA: Diagnosis not present

## 2017-08-15 DIAGNOSIS — J069 Acute upper respiratory infection, unspecified: Secondary | ICD-10-CM

## 2017-08-15 MED ORDER — BENZONATATE 200 MG PO CAPS: ORAL_CAPSULE | ORAL | 0 refills | Status: DC

## 2017-08-15 MED ORDER — DOXYCYCLINE HYCLATE 100 MG PO CAPS: 100.0000 mg | ORAL_CAPSULE | Freq: Two times a day (BID) | ORAL | 0 refills | Status: DC

## 2017-08-15 NOTE — Discharge Instructions (Addendum)
Take plain guaifenesin (1200mg  extended release tabs such as Mucinex) twice daily, with plenty of water, for cough and congestion.  Get adequate rest.   If sinus congestion develops, may use Afrin nasal spray (or generic oxymetazoline) each morning for about 5 days and then discontinue.  Also recommend using saline nasal spray several times daily and saline nasal irrigation (AYR is a common brand).  Use Flonase nasal spray each morning after using Afrin nasal spray and saline nasal irrigation. Try warm salt water gargles for sore throat.  Stop all antihistamines for now, and other non-prescription cough/cold preparations. May take Ibuprofen 200mg , 4 tabs every 8 hours with food for body aches, fever, etc. Begin Doxycycline if not improving about one week or if persistent fever develops

## 2017-08-15 NOTE — ED Provider Notes (Signed)
[facility]    CSN: [id number] Arrival date & time: 08/15/17  1810     History   Chief Complaint Chief Complaint  Patient presents with  . Cough    HPI Megan Mueller is a 27 y.o. female.   Patient developed a non-productive cough and sore throat two days ago.  Yesterday her cough became worse and she developed fever to 99.4 with chills.  She has a past history of asthma and pneumonia.  The history is provided by the patient.    Past Medical History:  Diagnosis Date  . Allergic rhinitis   . Epilepsy (HCC)   . Epilepsy (HCC)   . Seizures [facility](HCC)     Patient Active Problem List   Diagnosis Date Noted  . Annual physical exam 09/01/2015  . Seizure disorder, grand mal (HCC) 09/01/2015    Past Surgical History:  Procedure Laterality Date  . EYE SURGERY Left   . HAND SURGERY Left   . OTHER SURGICAL HISTORY     Vagas Nerve Stimulator implant    OB History   None      Home Medications    Prior to Admission medications   Medication Sig Start Date End Date Taking? Authorizing Provider  AMITRIPTYLINE HCL PO Take by mouth.    [provider]  benzonatate (TESSALON) 200 MG capsule Take one cap by mouth at bedtime as needed for cough.  May repeat in 4 to 6 hours 08/15/17   Lattie HawBeese, Nola Botkins A, MD  doxycycline (VIBRAMYCIN) 100 MG capsule Take 1 capsule (100 mg total) by mouth 2 (two) times daily. Take with food (Rx void after 08/23/17) 08/15/17   Lattie HawBeese, Carie Kapuscinski A, MD  fluconazole (DIFLUCAN) 200 MG tablet Take 1 tablet (200 mg total) by mouth daily. 08/03/17   Lurene ShadowPhelps, Erin O, PA-C  HYDROcodone-acetaminophen (NORCO/VICODIN) 5-325 MG tablet Take 1-2 tablets by mouth every 6 (six) hours as needed for moderate pain or severe pain. 11/26/16   Lurene ShadowPhelps, Erin O, PA-C  lamoTRIgine (LAMICTAL) 150 MG tablet Take 150 mg by mouth daily.    [provider]  naproxen (NAPROSYN) 500 MG tablet Take 1 tablet (500 mg total) by mouth 2 (two) times daily. 11/26/16   Lurene ShadowPhelps,  Erin O, PA-C  ondansetron (ZOFRAN) 4 MG tablet Take 1 tablet (4 mg total) every 8 (eight) hours as needed by mouth for nausea or vomiting. 03/10/17   Lattie HawBeese, Kayden Hutmacher A, MD  zonisamide (ZONEGRAN) 100 MG capsule Take 100 mg by mouth daily.    [provider]    Family History Family History  Problem Relation Age of Onset  . Hyperlipidemia Mother   . Hypertension Father   . Diabetes Father     Social History Social History   Tobacco Use  . Smoking status: Never Smoker  . Smokeless tobacco: Never Used  Substance Use Topics  . Alcohol use: No  . Drug use: No     Allergies   Patient has no known allergies.   Review of Systems Review of Systems + sore throat + cough No pleuritic pain No wheezing + nasal congestion + post-nasal drainage No sinus pain/pressure No itchy/red eyes No earache No hemoptysis No SOB + fever, + chills No nausea No vomiting No abdominal pain No diarrhea No urinary symptoms No skin rash + fatigue No myalgias No headache Used OTC meds without relief   Physical Exam Triage Vital Signs ED Triage Vitals  Enc Vitals Group     BP 08/15/17 1851 101/69  Pulse Rate 08/15/17 1851 (!) 108     Resp --      Temp 08/15/17 1851 98.4 F (36.9 C)     Temp Source 08/15/17 1851 Oral     SpO2 08/15/17 1851 100 %     Weight 08/15/17 1852 117 lb (53.1 kg)     Height 08/15/17 1852 5\' 5"  (1.651 m)     Head Circumference --      Peak Flow --      Pain Score 08/15/17 1852 0     Pain Loc --      Pain Edu? --      Excl. in GC? --    No data found.  Updated Vital Signs BP 101/69 (BP Location: Right Arm)   Pulse (!) 108   Temp 98.4 F (36.9 C) (Oral)   Ht 5\' 5"  (1.651 m)   Wt 117 lb (53.1 kg)   LMP 07/19/2017   SpO2 100%   BMI 19.47 kg/m   Visual Acuity Right Eye Distance:   Left Eye Distance:   Bilateral Distance:    Right Eye Near:   Left Eye Near:    Bilateral Near:     Physical Exam Nursing notes and Vital Signs  reviewed. Appearance:  Patient appears stated age, and in no acute distress Eyes:  Pupils are equal, round, and reactive to light and accomodation.  Extraocular movement is intact.  Conjunctivae are not inflamed  Ears:  Canals normal.  Tympanic membranes normal.  Nose:  Mildly congested turbinates.  No sinus tenderness.  Pharynx:  Normal Neck:  Supple.  Enlarged posterior/lateral nodes are palpated bilaterally, tender to palpation on the left.   Lungs:  Clear to auscultation.  Breath sounds are equal.  Moving air well. Heart:  Regular rate and rhythm without murmurs, rubs, or gallops.  Abdomen:  Nontender without masses or hepatosplenomegaly.  Bowel sounds are present.  No CVA or flank tenderness.  Extremities:  No edema.  Skin:  No rash present.    UC Treatments / Results  Labs (all labs ordered are listed, but only abnormal results are displayed) Labs Reviewed - No data to display  EKG None Radiology No results found.  Procedures Procedures (including critical care time)  Medications Ordered in UC Medications - No data to display   Initial Impression / Assessment and Plan / UC Course  I have reviewed the triage vital signs and the nursing notes.  Pertinent labs & imaging results that were available during my care of the patient were reviewed by me and considered in my medical decision making (see chart for details).    There is no evidence of bacterial infection today.  Treat symptomatically for now  Prescription written for Benzonatate (Tessalon) to take at bedtime for night-time cough.  Take plain guaifenesin (1200mg  extended release tabs such as Mucinex) twice daily, with plenty of water, for cough and congestion.  Get adequate rest.   If sinus congestion develops, may use Afrin nasal spray (or generic oxymetazoline) each morning for about 5 days and then discontinue.  Also recommend using saline nasal spray several times daily and saline nasal irrigation (AYR is a common  brand).  Use Flonase nasal spray each morning after using Afrin nasal spray and saline nasal irrigation. Try warm salt water gargles for sore throat.  Stop all antihistamines for now, and other non-prescription cough/cold preparations. May take Ibuprofen 200mg , 4 tabs every 8 hours with food for body aches, fever, etc. Begin Doxycycline if not  improving about one week or if persistent fever develops (Given a prescription to hold, with an expiration date)  Followup with Family Doctor if not improved in about 10 days.    Final Clinical Impressions(s) / UC Diagnoses   Final diagnoses:  Viral URI with cough    ED Discharge Orders        Ordered    benzonatate (TESSALON) 200 MG capsule     08/15/17 1912    doxycycline (VIBRAMYCIN) 100 MG capsule  2 times daily     08/15/17 1913          Lattie Haw, MD 08/17/17 1357

## 2017-08-15 NOTE — ED Triage Notes (Signed)
Cough since yesterday.  Keeping her up at night.  Pain in back, shoulders, and neck from coughing.  States that it goes from dry to wet cough.

## 2017-08-17 ENCOUNTER — Telehealth: Payer: Self-pay

## 2017-08-17 NOTE — Telephone Encounter (Signed)
Spoke to pt. Says shes feeling somewhat better. Advised pt to follow up with primary if sxs worsen.

## 2017-08-19 ENCOUNTER — Emergency Department
Admission: EM | Admit: 2017-08-19 | Discharge: 2017-08-19 | Disposition: A | Discharge status: Home / Self Care | Payer: BLUE CROSS/BLUE SHIELD | Source: Home / Self Care

## 2017-08-19 ENCOUNTER — Other Ambulatory Visit: Payer: Self-pay

## 2017-08-19 DIAGNOSIS — R1031 Right lower quadrant pain: Secondary | ICD-10-CM

## 2017-08-19 NOTE — ED Triage Notes (Signed)
Pt was here last week with stomach pain.  Pt stated that she is not feeling better, even more nausea and unable to eat.

## 2017-08-19 NOTE — ED Provider Notes (Signed)
[name]    CSN: [id number] Arrival date & time: 08/19/17  1146     History   Chief Complaint Chief Complaint  Patient presents with  . Abdominal Pain  . Nausea    HPI Mada Sadik is a 27 y.o. female.   The history is provided by the patient. No language interpreter was used.  Abdominal Pain  Pain location:  RLQ and LLQ Pain quality: aching   Pain radiates to:  LLQ and RUQ Pain severity:  Severe Onset quality:  Gradual Timing:  Constant Progression:  Worsening Chronicity:  New Context: not sick contacts   Relieved by:  Nothing Worsened by:  Nothing Ineffective treatments:  None tried Associated symptoms: nausea and vomiting   Pt reports she is unable to eat or drink.   Pt reports severe abdominal pain.  Pt reports pain felt like when she had a cyst rupture but pain is worse.   Past Medical History:  Diagnosis Date  . Allergic rhinitis   . Epilepsy (HCC)   . Epilepsy (HCC)   . Seizures [facility])     Patient Active Problem List   Diagnosis Date Noted  . Annual physical exam 09/01/2015  . Seizure disorder, grand mal (HCC) 09/01/2015    Past Surgical History:  Procedure Laterality Date  . EYE SURGERY Left   . HAND SURGERY Left   . OTHER SURGICAL HISTORY     Vagas Nerve Stimulator implant    OB History   None      Home Medications    Prior to Admission medications   Medication Sig Start Date End Date Taking? Authorizing Provider  AMITRIPTYLINE HCL PO Take by mouth.    [provider]  benzonatate (TESSALON) 200 MG capsule Take one cap by mouth at bedtime as needed for cough.  May repeat in 4 to 6 hours 08/15/17   Lattie Haw, MD  doxycycline (VIBRAMYCIN) 100 MG capsule Take 1 capsule (100 mg total) by mouth 2 (two) times daily. Take with food (Rx void after 08/23/17) 08/15/17   Lattie Haw, MD  fluconazole (DIFLUCAN) 200 MG tablet Take 1 tablet (200 mg total) by mouth daily. 08/03/17   Lurene Shadow, PA-C    HYDROcodone-acetaminophen (NORCO/VICODIN) 5-325 MG tablet Take 1-2 tablets by mouth every 6 (six) hours as needed for moderate pain or severe pain. 11/26/16   Lurene Shadow, PA-C  lamoTRIgine (LAMICTAL) 150 MG tablet Take 150 mg by mouth daily.    [provider]  naproxen (NAPROSYN) 500 MG tablet Take 1 tablet (500 mg total) by mouth 2 (two) times daily. 11/26/16   Lurene Shadow, PA-C  ondansetron (ZOFRAN) 4 MG tablet Take 1 tablet (4 mg total) every 8 (eight) hours as needed by mouth for nausea or vomiting. 03/10/17   Lattie Haw, MD  zonisamide (ZONEGRAN) 100 MG capsule Take 100 mg by mouth daily.    [provider]    Family History Family History  Problem Relation Age of Onset  . Hyperlipidemia Mother   . Hypertension Father   . Diabetes Father     Social History Social History   Tobacco Use  . Smoking status: Never Smoker  . Smokeless tobacco: Never Used  Substance Use Topics  . Alcohol use: No  . Drug use: No     Allergies   Patient has no known allergies.   Review of Systems Review of Systems  Gastrointestinal: Positive for abdominal pain, nausea and vomiting.  All  other systems reviewed and are negative.    Physical Exam Triage Vital Signs ED Triage Vitals  Enc Vitals Group     BP 08/19/17 1249 114/73     Pulse Rate 08/19/17 1249 (!) 114     Resp --      Temp 08/19/17 1249 98.2 F (36.8 C)     Temp Source 08/19/17 1249 Oral     SpO2 08/19/17 1249 99 %     Weight 08/19/17 1250 114 lb (51.7 kg)     Height --      Head Circumference --      Peak Flow --      Pain Score 08/19/17 1250 4     Pain Loc --      Pain Edu? --      Excl. in GC? --    No data found.  Updated Vital Signs BP 114/73 (BP Location: Right Arm)   Pulse (!) 114   Temp 98.2 F (36.8 C) (Oral)   Wt 114 lb (51.7 kg)   LMP 08/18/2017   SpO2 99%   BMI 18.97 kg/m   Visual Acuity Right Eye Distance:   Left Eye Distance:   Bilateral Distance:    Right Eye  Near:   Left Eye Near:    Bilateral Near:     Physical Exam  Constitutional: She appears well-developed and well-nourished.  HENT:  Head: Normocephalic.  Pulmonary/Chest: Effort normal.  Abdominal: Soft. Normal appearance and bowel sounds are normal. There is tenderness in the right lower quadrant and left lower quadrant. There is guarding.  Neurological: She is alert.  Skin: Skin is warm.  Nursing note and vitals reviewed.    UC Treatments / Results  Labs (all labs ordered are listed, but only abnormal results are displayed) Labs Reviewed - No data to display  EKG None  Radiology No results found.  Procedures Procedures (including critical care time)  Medications Ordered in UC Medications - No data to display  Initial Impression / Assessment and Plan / UC Course  I have reviewed the triage vital signs and the nursing notes.  Pertinent labs & imaging results that were available during my care of the patient were reviewed by me and considered in my medical decision making (see chart for details).   MDM  Pt has diffusely tender lower abdomen.  I am concerned about cyst/torsion/ appendicitis.   Pt advised to go to the Emergency department for evaluation  Final Clinical Impressions(s) / UC Diagnoses   Final diagnoses:  Right lower quadrant abdominal pain   Discharge Instructions   None    ED Prescriptions    None     Controlled Substance Prescriptions [place] Controlled Substance Registry consulted? Not Applicable   Elson Areas, [place] 08/19/17 1258

## 2017-08-19 NOTE — Discharge Instructions (Signed)
Go to the Emergency department °

## 2017-08-22 ENCOUNTER — Telehealth: Payer: Self-pay

## 2017-08-22 NOTE — Telephone Encounter (Signed)
Left msg for pt to find out if she had followed up at ED for additional work up as advised by Langston Masker, PA-C. Informed pt to call if she should have any additional questions or concerns.

## 2017-09-30 ENCOUNTER — Encounter: Payer: Self-pay | Admitting: Emergency Medicine

## 2017-09-30 ENCOUNTER — Emergency Department
Admission: EM | Admit: 2017-09-30 | Discharge: 2017-09-30 | Disposition: A | Discharge status: Home / Self Care | Payer: BLUE CROSS/BLUE SHIELD | Source: Home / Self Care | Attending: Family Medicine | Admitting: Family Medicine

## 2017-09-30 ENCOUNTER — Other Ambulatory Visit: Payer: Self-pay

## 2017-09-30 DIAGNOSIS — R300 Dysuria: Secondary | ICD-10-CM

## 2017-09-30 DIAGNOSIS — R350 Frequency of micturition: Secondary | ICD-10-CM

## 2017-09-30 LAB — POCT URINALYSIS DIP (MANUAL ENTRY)
Bilirubin, UA: NEGATIVE
Glucose, UA: NEGATIVE mg/dL
Ketones, POC UA: NEGATIVE mg/dL
Nitrite, UA: NEGATIVE
Protein Ur, POC: 30 mg/dL — AB
Spec Grav, UA: >=1.03 — AB (ref 1.010–1.025)
Urobilinogen, UA: 0.2 E.U./dL
pH, UA: 5.5 (ref 5.0–8.0)

## 2017-09-30 MED ORDER — CEPHALEXIN 500 MG PO CAPS: 500.0000 mg | ORAL_CAPSULE | Freq: Two times a day (BID) | ORAL | 0 refills | Status: DC

## 2017-09-30 NOTE — ED Provider Notes (Signed)
[name]    CSN: [id number] Arrival date & time: 09/30/17  1548     History   Chief Complaint Chief Complaint  Patient presents with  . Dysuria    HPI Megan Mueller is a 27 y.o. female.   HPI  Megan Mueller is a 27 y.o. female presenting to UC with c/o sudden onset dysuria and urinary frequency that started this morning, gradually worsening. Hx of UTIs in the past. Symptoms feel similar. Denies abdominal pain or back pain. Denies fever, chills, n/v/d.    Past Medical History:  Diagnosis Date  . Allergic rhinitis   . Epilepsy (HCC)   . Epilepsy (HCC)   . Seizures [facility])     Patient Active Problem List   Diagnosis Date Noted  . Annual physical exam 09/01/2015  . Seizure disorder, grand mal (HCC) 09/01/2015    Past Surgical History:  Procedure Laterality Date  . EYE SURGERY Left   . HAND SURGERY Left   . OTHER SURGICAL HISTORY     Vagas Nerve Stimulator implant    OB History   None      Home Medications    Prior to Admission medications   Medication Sig Start Date End Date Taking? Authorizing Provider  AMITRIPTYLINE HCL PO Take by mouth.    [provider]  benzonatate (TESSALON) 200 MG capsule Take one cap by mouth at bedtime as needed for cough.  May repeat in 4 to 6 hours 08/15/17   Lattie Haw, MD  cephALEXin (KEFLEX) 500 MG capsule Take 1 capsule (500 mg total) by mouth 2 (two) times daily. 09/30/17   Lurene Shadow, PA-C  doxycycline (VIBRAMYCIN) 100 MG capsule Take 1 capsule (100 mg total) by mouth 2 (two) times daily. Take with food (Rx void after 08/23/17) 08/15/17   Lattie Haw, MD  fluconazole (DIFLUCAN) 200 MG tablet Take 1 tablet (200 mg total) by mouth daily. 08/03/17   Lurene Shadow, PA-C  HYDROcodone-acetaminophen (NORCO/VICODIN) 5-325 MG tablet Take 1-2 tablets by mouth every 6 (six) hours as needed for moderate pain or severe pain. 11/26/16   Lurene Shadow, PA-C  lamoTRIgine (LAMICTAL) 150 MG tablet Take 150 mg  by mouth daily.    [provider]  naproxen (NAPROSYN) 500 MG tablet Take 1 tablet (500 mg total) by mouth 2 (two) times daily. 11/26/16   Lurene Shadow, PA-C  ondansetron (ZOFRAN) 4 MG tablet Take 1 tablet (4 mg total) every 8 (eight) hours as needed by mouth for nausea or vomiting. 03/10/17   Lattie Haw, MD  zonisamide (ZONEGRAN) 100 MG capsule Take 100 mg by mouth daily.    [provider]    Family History Family History  Problem Relation Age of Onset  . Hyperlipidemia Mother   . Hypertension Father   . Diabetes Father     Social History Social History   Tobacco Use  . Smoking status: Never Smoker  . Smokeless tobacco: Never Used  Substance Use Topics  . Alcohol use: No  . Drug use: No     Allergies   Doxycycline   Review of Systems Review of Systems  Constitutional: Negative for chills and fever.  Gastrointestinal: Negative for abdominal pain, nausea and vomiting.  Genitourinary: Positive for dysuria, frequency and urgency. Negative for hematuria and pelvic pain.  Musculoskeletal: Negative for back pain.     Physical Exam Triage Vital Signs ED Triage Vitals  Enc Vitals Group     BP 09/30/17  1616 98/68     Pulse Rate 09/30/17 1616 81     Resp 09/30/17 1616 16     Temp 09/30/17 1616 98.2 F (36.8 C)     Temp Source 09/30/17 1616 Oral     SpO2 09/30/17 1616 100 %     Weight 09/30/17 1617 117 lb (53.1 kg)     Height 09/30/17 1617 5\' 5"  (1.651 m)     Head Circumference --      Peak Flow --      Pain Score 09/30/17 1617 1     Pain Loc --      Pain Edu? --      Excl. in GC? --    No data found.  Updated Vital Signs BP 98/68 (BP Location: Right Arm)   Pulse 81   Temp 98.2 F (36.8 C) (Oral)   Resp 16   Ht 5\' 5"  (1.651 m)   Wt 117 lb (53.1 kg)   LMP 09/22/2017 (Exact Date)   SpO2 100%   BMI 19.47 kg/m   Visual Acuity Right Eye Distance:   Left Eye Distance:   Bilateral Distance:    Right Eye Near:   Left Eye Near:      Bilateral Near:     Physical Exam  Constitutional: She is oriented to person, place, and time. She appears well-developed and well-nourished. No distress.  HENT:  Head: Normocephalic and atraumatic.  Mouth/Throat: Oropharynx is clear and moist.  Eyes: EOM are normal.  Neck: Normal range of motion.  Cardiovascular: Normal rate and regular rhythm.  Pulmonary/Chest: Effort normal and breath sounds normal. No stridor. No respiratory distress. She has no wheezes. She has no rales.  Abdominal: Soft. She exhibits no distension. There is no tenderness. There is no CVA tenderness.  Musculoskeletal: Normal range of motion.  Neurological: She is alert and oriented to person, place, and time.  Skin: Skin is warm and dry. She is not diaphoretic.  Psychiatric: She has a normal mood and affect. Her behavior is normal.  Nursing note and vitals reviewed.    UC Treatments / Results  Labs (all labs ordered are listed, but only abnormal results are displayed) Labs Reviewed  POCT URINALYSIS DIP (MANUAL ENTRY) - Abnormal; Notable for the following components:      Result Value   Spec Grav, UA >=1.030 (*)    Blood, UA small (*)    Protein Ur, POC =30 (*)    Leukocytes, UA Trace (*)    All other components within normal limits  URINE CULTURE    EKG None  Radiology No results found.  Procedures Procedures (including critical care time)  Medications Ordered in UC Medications - No data to display  Initial Impression / Assessment and Plan / UC Course  I have reviewed the triage vital signs and the nursing notes.  Pertinent labs & imaging results that were available during my care of the patient were reviewed by me and considered in my medical decision making (see chart for details).     Hx and UA c/w likely early UTI Culture sent  Final Clinical Impressions(s) / UC Diagnoses   Final diagnoses:  Dysuria  Urinary frequency     Discharge Instructions       Please try to stay  well hydrated and take medication as prescribed.  You will be notified of urine culture results in about 2-3 days.  Please follow up with family medicine in 1 week if not improving.  ED Prescriptions    Medication Sig Dispense Auth. Provider   cephALEXin (KEFLEX) 500 MG capsule Take 1 capsule (500 mg total) by mouth 2 (two) times daily. 14 capsule Lurene ShadowPhelps, Cong Hightower O, PA-C     Controlled Substance Prescriptions [place] Controlled Substance Registry consulted? Not Applicable   Rolla Platehelps, Saron Tweed O, PA-C 09/30/17 1654

## 2017-09-30 NOTE — Discharge Instructions (Signed)
° °  Please try to stay well hydrated and take medication as prescribed.  You will be notified of urine culture results in about 2-3 days.  Please follow up with family medicine in 1 week if not improving.

## 2017-09-30 NOTE — ED Triage Notes (Signed)
Reports onset of dysuria and frequency today.

## 2017-10-02 ENCOUNTER — Telehealth: Payer: Self-pay

## 2017-10-02 LAB — URINE CULTURE
MICRO NUMBER:: 90700097
SPECIMEN QUALITY:: ADEQUATE

## 2017-10-02 NOTE — Telephone Encounter (Signed)
Left message with lab results and to call if any questions or concerns. 

## 2018-02-07 ENCOUNTER — Emergency Department
Admission: EM | Admit: 2018-02-07 | Discharge: 2018-02-07 | Disposition: A | Discharge status: Home / Self Care | Payer: BLUE CROSS/BLUE SHIELD | Source: Home / Self Care | Attending: Family Medicine | Admitting: Family Medicine

## 2018-02-07 ENCOUNTER — Other Ambulatory Visit: Payer: Self-pay

## 2018-02-07 DIAGNOSIS — R6883 Chills (without fever): Secondary | ICD-10-CM | POA: Diagnosis not present

## 2018-02-07 DIAGNOSIS — R112 Nausea with vomiting, unspecified: Secondary | ICD-10-CM

## 2018-02-07 DIAGNOSIS — R1031 Right lower quadrant pain: Secondary | ICD-10-CM

## 2018-02-07 NOTE — Discharge Instructions (Signed)
°  You have declined EMS transport. It is still recommended you go to the hospital today for further evaluation and treatment of your abdominal pain because it could be your appendix which is a medical emergency. Please do not eat or drink anything along the way.

## 2018-02-07 NOTE — ED Provider Notes (Signed)
[name]    CSN: [id number] Arrival date & time: 02/07/18  0940     History   Chief Complaint Chief Complaint  Patient presents with  . Emesis  . Nasal Congestion    HPI Megan Mueller is a 27 y.o. female.   HPI Megan Mueller is a 27 y.o. female presenting to UC with father with c/o sudden onset abdominal pain, nausea and vomiting. Associated chills and nasal congestion.  She has vomited twice today.  She has taken Tylenol cold & sinus but no relief of pain or chills. No recorded fever at home. She still has her appendix. She also has a hx of ovarian cysts. Pt notes this pain is severe and has been constant since onset around 9AM this morning.   Past Medical History:  Diagnosis Date  . Allergic rhinitis   . Epilepsy (HCC)   . Epilepsy (HCC)   . Seizures [facility])     Patient Active Problem List   Diagnosis Date Noted  . Annual physical exam 09/01/2015  . Seizure disorder, grand mal (HCC) 09/01/2015    Past Surgical History:  Procedure Laterality Date  . EYE SURGERY Left   . HAND SURGERY Left   . OTHER SURGICAL HISTORY     Vagas Nerve Stimulator implant    OB History   None      Home Medications    Prior to Admission medications   Medication Sig Start Date End Date Taking? Authorizing Provider  AMITRIPTYLINE HCL PO Take by mouth.    [provider]  benzonatate (TESSALON) 200 MG capsule Take one cap by mouth at bedtime as needed for cough.  May repeat in 4 to 6 hours 08/15/17   Lattie Haw, MD  cephALEXin (KEFLEX) 500 MG capsule Take 1 capsule (500 mg total) by mouth 2 (two) times daily. 09/30/17   Lurene Shadow, PA-C  doxycycline (VIBRAMYCIN) 100 MG capsule Take 1 capsule (100 mg total) by mouth 2 (two) times daily. Take with food (Rx void after 08/23/17) 08/15/17   Lattie Haw, MD  fluconazole (DIFLUCAN) 200 MG tablet Take 1 tablet (200 mg total) by mouth daily. 08/03/17   Lurene Shadow, PA-C  HYDROcodone-acetaminophen  (NORCO/VICODIN) 5-325 MG tablet Take 1-2 tablets by mouth every 6 (six) hours as needed for moderate pain or severe pain. 11/26/16   Lurene Shadow, PA-C  lamoTRIgine (LAMICTAL) 150 MG tablet Take 150 mg by mouth daily.    [provider]  naproxen (NAPROSYN) 500 MG tablet Take 1 tablet (500 mg total) by mouth 2 (two) times daily. 11/26/16   Lurene Shadow, PA-C  ondansetron (ZOFRAN) 4 MG tablet Take 1 tablet (4 mg total) every 8 (eight) hours as needed by mouth for nausea or vomiting. 03/10/17   Lattie Haw, MD  zonisamide (ZONEGRAN) 100 MG capsule Take 100 mg by mouth daily.    [provider]    Family History Family History  Problem Relation Age of Onset  . Hyperlipidemia Mother   . Hypertension Father   . Diabetes Father     Social History Social History   Tobacco Use  . Smoking status: Never Smoker  . Smokeless tobacco: Never Used  Substance Use Topics  . Alcohol use: No  . Drug use: No     Allergies   Doxycycline and Pseudoephedrine   Review of Systems Review of Systems  Constitutional: Positive for chills. Negative for fever.  HENT: Positive for congestion and sinus pressure.  Negative for sinus pain.   Gastrointestinal: Positive for abdominal pain, nausea and vomiting. Negative for blood in stool and diarrhea.  Genitourinary: Negative for dysuria, flank pain, frequency and hematuria.  Musculoskeletal: Negative for back pain.     Physical Exam Triage Vital Signs ED Triage Vitals  Enc Vitals Group     BP 02/07/18 1001 92/64     Pulse Rate 02/07/18 1001 96     Resp --      Temp 02/07/18 1001 (!) 97.4 F (36.3 C)     Temp Source 02/07/18 1001 Oral     SpO2 02/07/18 1001 100 %     Weight 02/07/18 1002 114 lb (51.7 kg)     Height 02/07/18 1002 5\' 5"  (1.651 m)     Head Circumference --      Peak Flow --      Pain Score 02/07/18 1002 10     Pain Loc --      Pain Edu? --      Excl. in GC? --    No data found.  Updated Vital Signs BP  92/64 (BP Location: Right Arm)   Pulse 96   Temp (!) 97.4 F (36.3 C) (Oral)   Ht 5\' 5"  (1.651 m)   Wt 114 lb (51.7 kg)   SpO2 100%   BMI 18.97 kg/m   Visual Acuity Right Eye Distance:   Left Eye Distance:   Bilateral Distance:    Right Eye Near:   Left Eye Near:    Bilateral Near:     Physical Exam  Constitutional: She is oriented to person, place, and time. She appears well-developed and well-nourished. She appears distressed.  Pt lying in exam bed, shivering with chills under a blanket. Waste bucket on the ground next to pt. Pt appears acutely ill.  HENT:  Head: Normocephalic and atraumatic.  Mouth/Throat: Oropharynx is clear and moist.  Eyes: EOM are normal.  Neck: Normal range of motion. Neck supple.  Cardiovascular: Normal rate and regular rhythm.  Pulmonary/Chest: Effort normal and breath sounds normal.  Abdominal: Soft. She exhibits no distension and no mass. There is tenderness. There is rebound. There is no guarding.    Musculoskeletal: Normal range of motion.  Neurological: She is alert and oriented to person, place, and time.  Skin: Skin is warm and dry. She is not diaphoretic.  Psychiatric: She has a normal mood and affect. Her behavior is normal.  Nursing note and vitals reviewed.    UC Treatments / Results  Labs (all labs ordered are listed, but only abnormal results are displayed) Labs Reviewed - No data to display  EKG None  Radiology No results found.  Procedures Procedures (including critical care time)  Medications Ordered in UC Medications - No data to display  Initial Impression / Assessment and Plan / UC Course  I have reviewed the triage vital signs and the nursing notes.  Pertinent labs & imaging results that were available during my care of the patient were reviewed by me and considered in my medical decision making (see chart for details).     Concern for appendicitis given sudden onset of symptoms with associated  chills. Offered to call EMS for transport to ED. Pt and father declined. Father will take pt [name]  Final Clinical Impressions(s) / UC Diagnoses   Final diagnoses:  Chills  Nausea and vomiting in adult  RLQ abdominal pain     Discharge Instructions      You have  declined EMS transport. It is still recommended you go to the hospital today for further evaluation and treatment of your abdominal pain because it could be your appendix which is a medical emergency. Please do not eat or drink anything along the way.     ED Prescriptions    None     Controlled Substance Prescriptions [place] Controlled Substance Registry consulted? Not Applicable   Rolla Plate 02/07/18 1047

## 2018-02-07 NOTE — ED Triage Notes (Signed)
Pt started with sinus congestion Thursday, worse Friday.  Had abdominal pain at 9:05 this am, and vomited at 9:10.

## 2018-06-08 LAB — HM PAP SMEAR: HM Pap smear: NEGATIVE

## 2018-10-29 ENCOUNTER — Encounter: Payer: Self-pay | Admitting: Sports Medicine

## 2018-12-28 IMAGING — US US PELVIS COMPLETE
1 series · 13 of 25 positions shown · non-contrast
Comparison: CT Abdomen and Pelvis 10/19/2015.

CLINICAL DATA: 25-year-old female with mid to lower abdominal and
pelvic pain for 2 days. LMP yesterday.



[Series 1: us pelvis complete · 0.15mm/px · 13 of 88 slices shown]
[im 1/88]
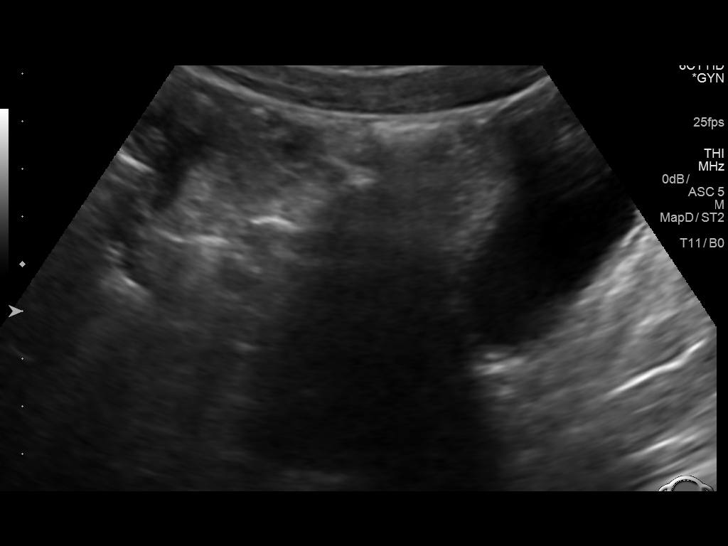
[im 8/88]
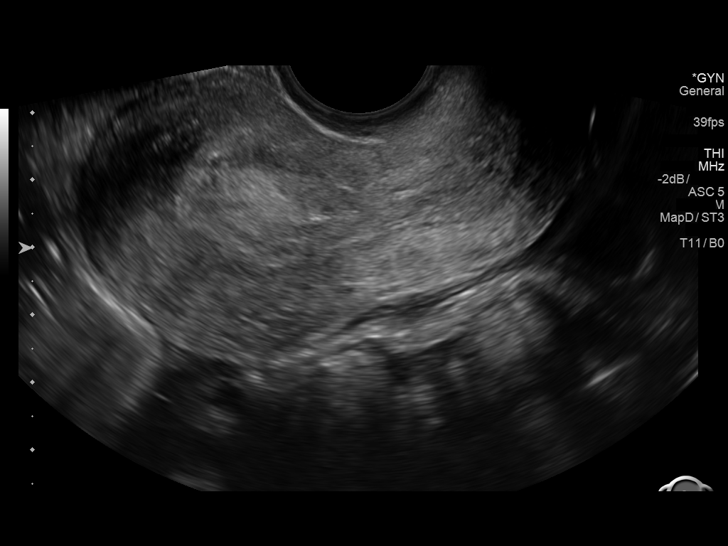
[im 15/88]
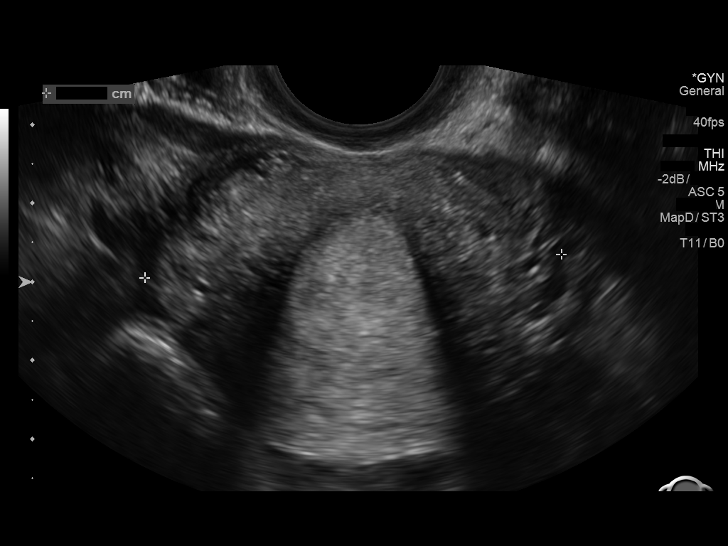
[im 22/88]
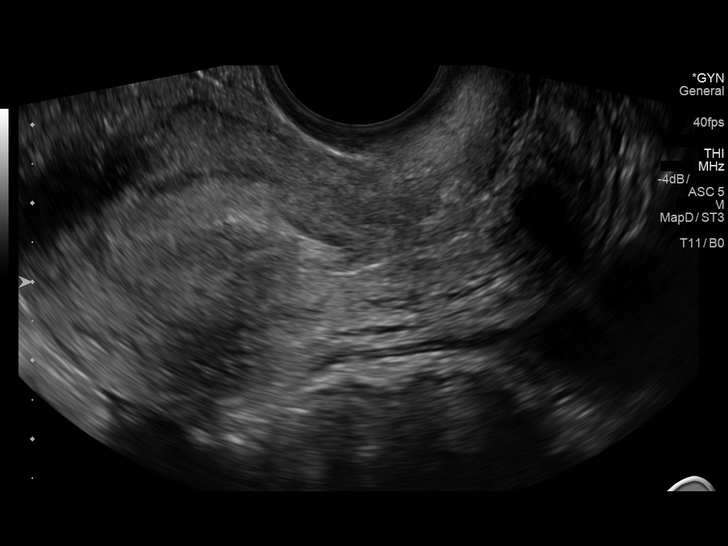
[im 30/88]
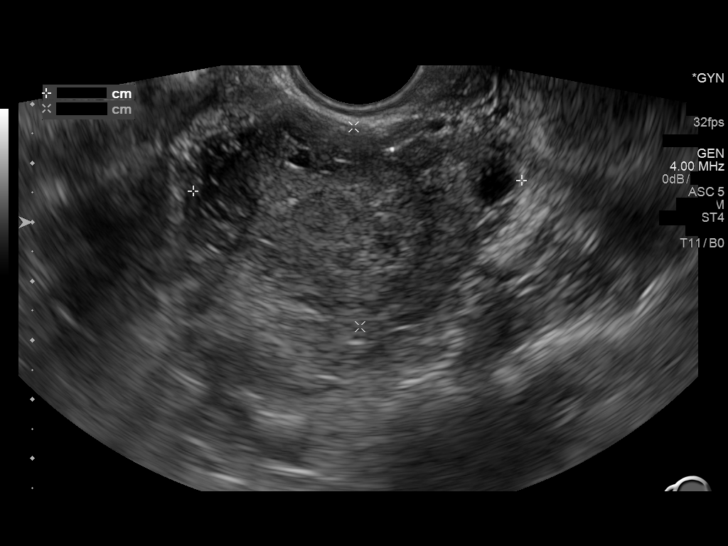
[im 37/88]
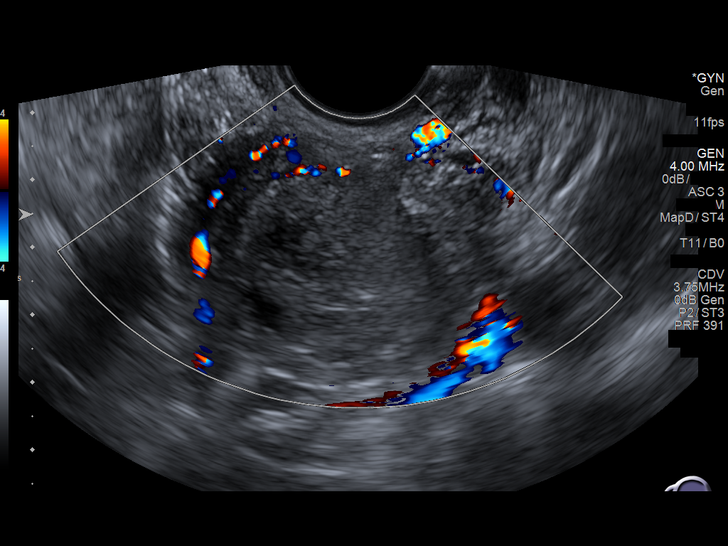
[im 44/88]
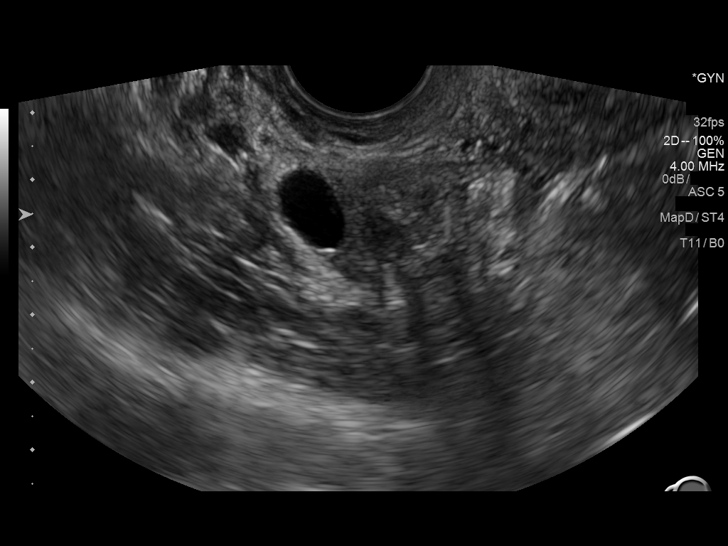
[im 51/88]
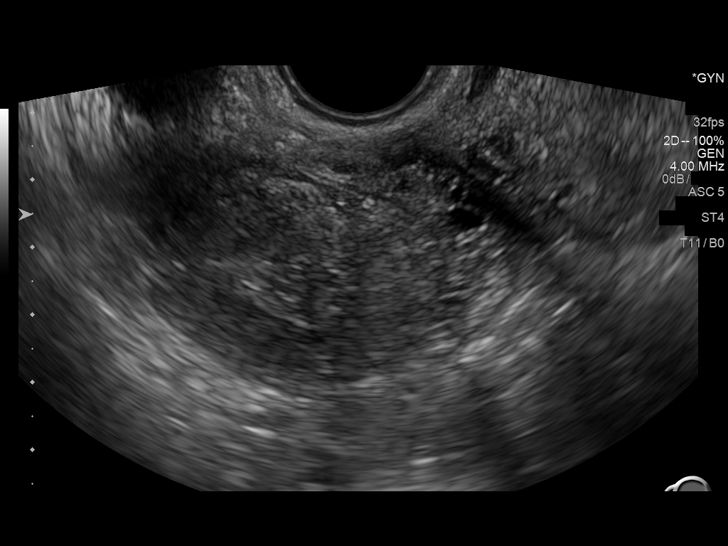
[im 59/88]
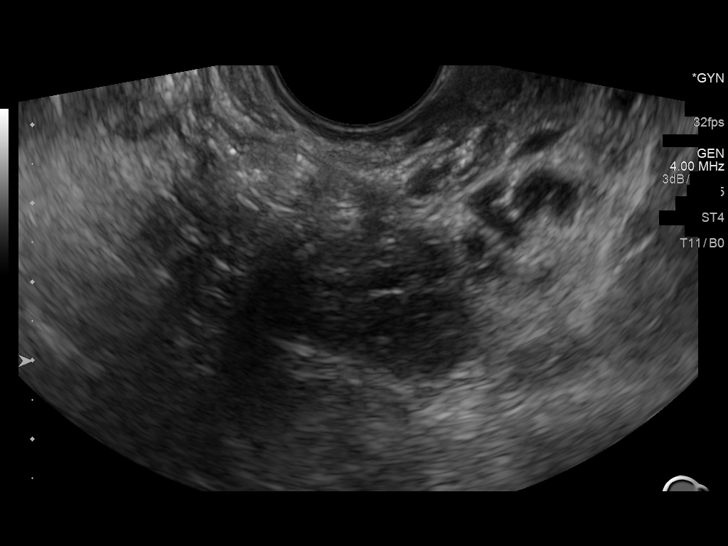
[im 66/88]
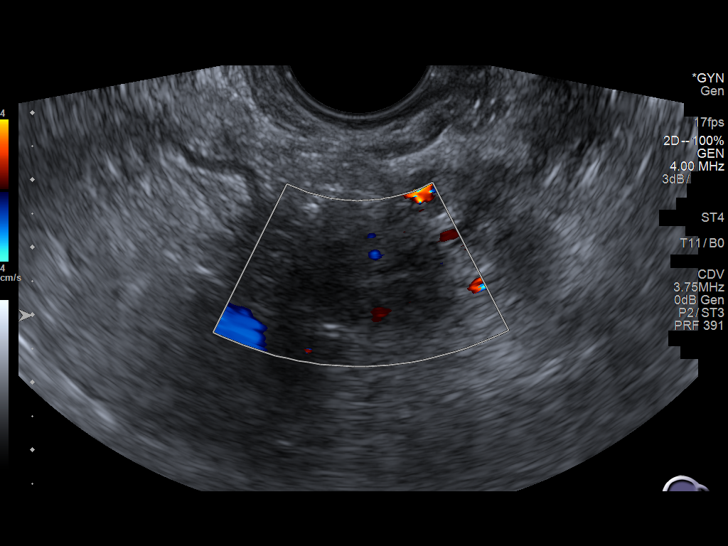
[im 73/88]
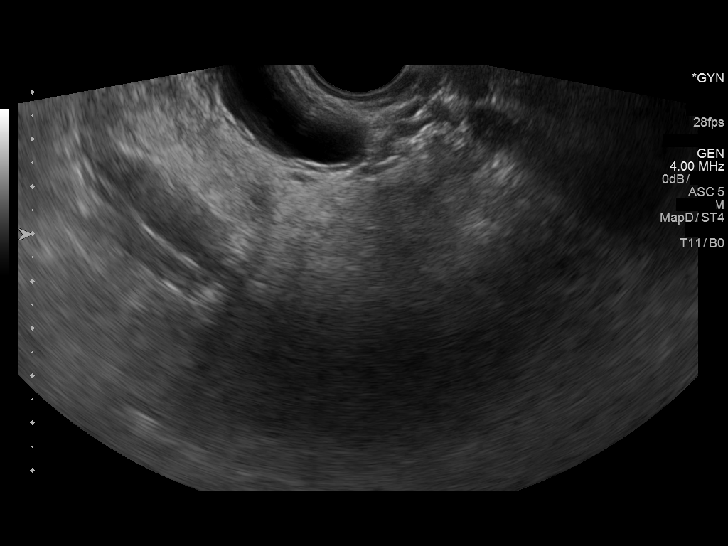
[im 80/88]
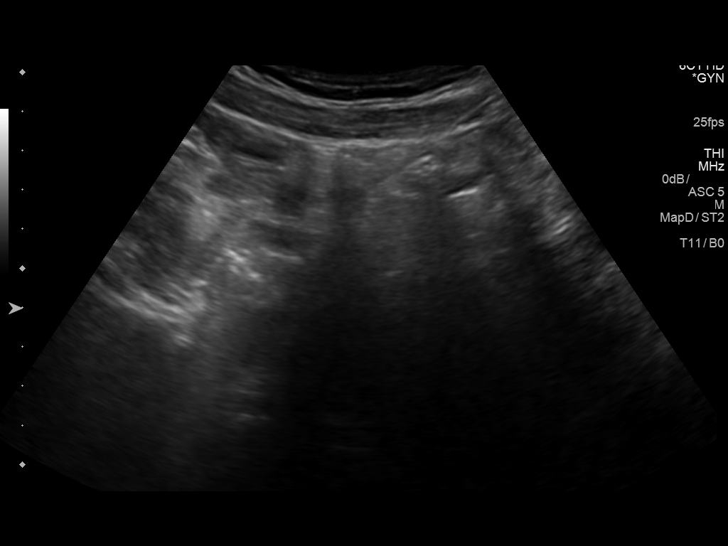
[im 88/88]
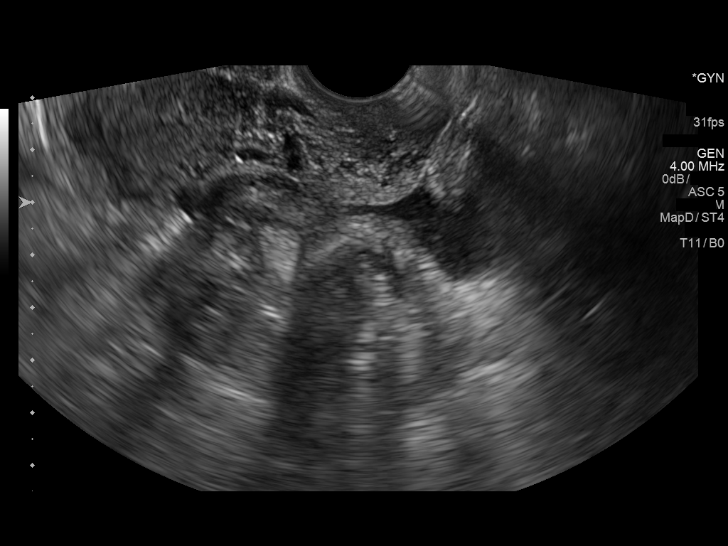

[13 of 25 positions shown; findings below may reference images not displayed]

FINDINGS: Uterus

Measurements: 7.2 x 4.4 x 5.3 cm. No fibroids or other mass
visualized.

Endometrium

Thickness: 16 mm.  No focal abnormality visualized.

Right ovary

Measurements: Enlarged, 5.6 x 3.4 x 5.2 cm. Rounded isoechoic to
slightly hyperechoic to background parenchyma mass with some
internal vascularity measuring 4 cm (images 48 through 59). The
remaining right ovarian parenchyma appears normal with occasional
small follicles.

Left ovary

Measurements: 2.9 x 1.8 x 2.0 cm. Normal appearance/no adnexal mass.

Other findings

Trace simple appearing pelvic free fluid in the cul-de-sac.
IMPRESSION: 1. Solid versus complex cystic 4 cm mass-like area within the right
ovary. Recommend quantitative beta HCG to exclude the possibility of
ectopic pregnancy, and assuming that is negative a follow-up Pelvis
MRI (gyn protocol without and with contrast) to further
characterize.
2. Normal left ovary and uterus. Trace pelvic free fluid is likely
physiologic.

## 2019-09-24 ENCOUNTER — Emergency Department
Admission: EM | Admit: 2019-09-24 | Discharge: 2019-09-24 | Disposition: A | Discharge status: Home / Self Care | Payer: BLUE CROSS/BLUE SHIELD | Source: Home / Self Care

## 2019-09-24 ENCOUNTER — Other Ambulatory Visit: Payer: Self-pay

## 2019-09-24 DIAGNOSIS — R0981 Nasal congestion: Secondary | ICD-10-CM | POA: Diagnosis not present

## 2019-09-24 DIAGNOSIS — J029 Acute pharyngitis, unspecified: Secondary | ICD-10-CM

## 2019-09-24 MED ORDER — IPRATROPIUM BROMIDE 0.06 % NA SOLN: 2.0000 | Freq: Four times a day (QID) | NASAL | 1 refills | Status: DC

## 2019-09-24 MED ORDER — CEPACOL EXTRA STRENGTH 15-2.6 MG MT LOZG: 1.0000 | LOZENGE | Freq: Four times a day (QID) | OROMUCOSAL | 0 refills | Status: DC | PRN

## 2019-09-24 NOTE — Discharge Instructions (Signed)
  You may take 500mg acetaminophen every 4-6 hours or in combination with ibuprofen 400-600mg every 6-8 hours as needed for pain, inflammation, and fever.  Be sure to well hydrated with clear liquids and get at least 8 hours of sleep at night, preferably more while sick.   Please follow up with family medicine in 1 week if needed.   

## 2019-09-24 NOTE — ED Provider Notes (Signed)
[name]    CSN: [id number] Arrival date & time: 09/24/19  8413      History   Chief Complaint Chief Complaint  Patient presents with  . Nasal Congestion    HPI Megan Mueller is a 29 y.o. female.   HPI  Megan Mueller is a 29 y.o. female presenting to UC with c/o sinus congestion and nasal drainage with clear nasal discharge since yesterday. Associated mild sore throat. She has taken Tylenol Cold and Sinus w/o relief. Pt states she cannot take antihistamines due to nausea. Denies fever, chills, n/v/d. No sick contacts or recent travel. No chest pain, cough or SOB.   Past Medical History:  Diagnosis Date  . Allergic rhinitis   . Epilepsy ([place])   . Epilepsy ([place])   . Seizures [facility])     Patient Active Problem List   Diagnosis Date Noted  . Annual physical exam 09/01/2015  . Seizure disorder, grand mal ([place]) 09/01/2015    Past Surgical History:  Procedure Laterality Date  . EYE SURGERY Left   . HAND SURGERY Left   . OTHER SURGICAL HISTORY     Vagas Nerve Stimulator implant    OB History   No obstetric history on file.      Home Medications    Prior to Admission medications   Medication Sig Start Date End Date Taking? Authorizing Provider  lamoTRIgine (LAMICTAL) 150 MG tablet Take 150 mg by mouth daily.   Yes [provider]  sertraline (ZOLOFT) 50 MG tablet Take 50 mg by mouth daily.   Yes [provider]  zonisamide (ZONEGRAN) 100 MG capsule Take 100 mg by mouth daily.   Yes [provider]  AMITRIPTYLINE HCL PO Take by mouth.    [provider]  Benzocaine-Menthol (CEPACOL EXTRA STRENGTH) 15-2.6 MG LOZG Use as directed 1 lozenge in the mouth or throat 4 (four) times daily as needed (for sore throat). 09/24/19   Noe Gens, PA-C  benzonatate (TESSALON) 200 MG capsule Take one cap by mouth at bedtime as needed for cough.  May repeat in 4 to 6 hours 08/15/17   Kandra Nicolas, MD  cephALEXin (KEFLEX) 500 MG  capsule Take 1 capsule (500 mg total) by mouth 2 (two) times daily. 09/30/17   Noe Gens, PA-C  doxycycline (VIBRAMYCIN) 100 MG capsule Take 1 capsule (100 mg total) by mouth 2 (two) times daily. Take with food (Rx void after 08/23/17) 08/15/17   Kandra Nicolas, MD  fluconazole (DIFLUCAN) 200 MG tablet Take 1 tablet (200 mg total) by mouth daily. 08/03/17   Noe Gens, PA-C  HYDROcodone-acetaminophen (NORCO/VICODIN) 5-325 MG tablet Take 1-2 tablets by mouth every 6 (six) hours as needed for moderate pain or severe pain. 11/26/16   Noe Gens, PA-C  ipratropium (ATROVENT) 0.06 % nasal spray Place 2 sprays into both nostrils 4 (four) times daily. 09/24/19   Noe Gens, PA-C  naproxen (NAPROSYN) 500 MG tablet Take 1 tablet (500 mg total) by mouth 2 (two) times daily. 11/26/16   Noe Gens, PA-C  ondansetron (ZOFRAN) 4 MG tablet Take 1 tablet (4 mg total) every 8 (eight) hours as needed by mouth for nausea or vomiting. 03/10/17   Kandra Nicolas, MD    Family History Family History  Problem Relation Age of Onset  . Hyperlipidemia Mother   . Hypertension Father   . Diabetes Father     Social History Social History   Tobacco Use  .  Smoking status: Never Smoker  . Smokeless tobacco: Never Used  Substance Use Topics  . Alcohol use: No  . Drug use: No     Allergies   Antihistamines, diphenhydramine-type; Doxycycline; and Pseudoephedrine   Review of Systems Review of Systems  Constitutional: Negative for chills and fever.  HENT: Positive for congestion, postnasal drip, rhinorrhea and sore throat. Negative for ear pain, trouble swallowing and voice change.   Respiratory: Negative for cough and shortness of breath.   Cardiovascular: Negative for chest pain and palpitations.  Gastrointestinal: Negative for abdominal pain, diarrhea, nausea and vomiting.  Musculoskeletal: Negative for arthralgias, back pain and myalgias.  Skin: Negative for rash.  Neurological: Negative for  dizziness, light-headedness and headaches.  All other systems reviewed and are negative.    Physical Exam Triage Vital Signs ED Triage Vitals  Enc Vitals Group     BP 09/24/19 0851 108/74     Pulse Rate 09/24/19 0851 77     Resp 09/24/19 0851 16     Temp 09/24/19 0851 98.4 F (36.9 C)     Temp Source 09/24/19 0851 Oral     SpO2 09/24/19 0851 100 %     Weight --      Height --      Head Circumference --      Peak Flow --      Pain Score 09/24/19 0848 7     Pain Loc --      Pain Edu? --      Excl. in GC? --    No data found.  Updated Vital Signs BP 108/74 (BP Location: Right Arm)   Pulse 77   Temp 98.4 F (36.9 C) (Oral)   Resp 16   LMP 09/15/2019 (Approximate)   SpO2 100%   Visual Acuity Right Eye Distance:   Left Eye Distance:   Bilateral Distance:    Right Eye Near:   Left Eye Near:    Bilateral Near:     Physical Exam Vitals and nursing note reviewed.  Constitutional:      General: She is not in acute distress.    Appearance: Normal appearance. She is well-developed. She is not ill-appearing, toxic-appearing or diaphoretic.  HENT:     Head: Normocephalic and atraumatic.     Right Ear: Tympanic membrane and ear canal normal.     Left Ear: Tympanic membrane and ear canal normal.     Nose: Congestion and rhinorrhea present.     Right Sinus: No maxillary sinus tenderness or frontal sinus tenderness.     Left Sinus: No maxillary sinus tenderness or frontal sinus tenderness.     Mouth/Throat:     Lips: Pink.     Mouth: Mucous membranes are moist.     Pharynx: Oropharynx is clear. Uvula midline.  Cardiovascular:     Rate and Rhythm: Normal rate and regular rhythm.  Pulmonary:     Effort: Pulmonary effort is normal. No respiratory distress.     Breath sounds: Normal breath sounds. No stridor. No wheezing, rhonchi or rales.  Musculoskeletal:        General: Normal range of motion.     Cervical back: Normal range of motion.  Skin:    General: Skin is warm  and dry.  Neurological:     Mental Status: She is alert and oriented to person, place, and time.  Psychiatric:        Behavior: Behavior normal.      UC Treatments / Results  Labs (all  labs ordered are listed, but only abnormal results are displayed) Labs Reviewed - No data to display  EKG   Radiology No results found.  Procedures Procedures (including critical care time)  Medications Ordered in UC Medications - No data to display  Initial Impression / Assessment and Plan / UC Course  I have reviewed the triage vital signs and the nursing notes.  Pertinent labs & imaging results that were available during my care of the patient were reviewed by me and considered in my medical decision making (see chart for details).     No evidence of bacterial infection at this time. Will tx symptomatically AVS provided  Final Clinical Impressions(s) / UC Diagnoses   Final diagnoses:  Nasal congestion  Sore throat     Discharge Instructions      You may take 500mg  acetaminophen every 4-6 hours or in combination with ibuprofen 400-600mg  every 6-8 hours as needed for pain, inflammation, and fever.  Be sure to well hydrated with clear liquids and get at least 8 hours of sleep at night, preferably more while sick.   Please follow up with family medicine in 1 week if needed.     ED Prescriptions    Medication Sig Dispense Auth. Provider   ipratropium (ATROVENT) 0.06 % nasal spray Place 2 sprays into both nostrils 4 (four) times daily. 15 mL Maigen Mozingo O, PA-C   Benzocaine-Menthol (CEPACOL EXTRA STRENGTH) 15-2.6 MG LOZG Use as directed 1 lozenge in the mouth or throat 4 (four) times daily as needed (for sore throat). 16 lozenge 12-02-1986, Lurene Shadow     PDMP not reviewed this encounter.   [place], Lurene Shadow 09/24/19 1136

## 2019-09-24 NOTE — ED Triage Notes (Signed)
Patient presents to Urgent Care with complaints of sinus drainage since yesterday. Patient reports she is allergic to antihistamines and so can only take tylenol cold and sinus and it is not working.

## 2020-08-07 DIAGNOSIS — Z9689 Presence of other specified functional implants: Secondary | ICD-10-CM | POA: Diagnosis not present

## 2020-08-07 DIAGNOSIS — G40319 Generalized idiopathic epilepsy and epileptic syndromes, intractable, without status epilepticus: Secondary | ICD-10-CM | POA: Diagnosis not present

## 2020-08-07 DIAGNOSIS — Z5181 Encounter for therapeutic drug level monitoring: Secondary | ICD-10-CM | POA: Diagnosis not present

## 2020-08-07 DIAGNOSIS — G43009 Migraine without aura, not intractable, without status migrainosus: Secondary | ICD-10-CM | POA: Diagnosis not present

## 2020-09-01 ENCOUNTER — Other Ambulatory Visit: Payer: Self-pay

## 2020-09-01 ENCOUNTER — Emergency Department
Admission: EM | Admit: 2020-09-01 | Discharge: 2020-09-01 | Disposition: A | Discharge status: Home / Self Care | Payer: 59 | Source: Home / Self Care

## 2020-09-01 ENCOUNTER — Encounter: Payer: Self-pay | Admitting: Emergency Medicine

## 2020-09-01 ENCOUNTER — Emergency Department (INDEPENDENT_AMBULATORY_CARE_PROVIDER_SITE_OTHER): Payer: 59

## 2020-09-01 DIAGNOSIS — M25521 Pain in right elbow: Secondary | ICD-10-CM

## 2020-09-01 DIAGNOSIS — S46911A Strain of unspecified muscle, fascia and tendon at shoulder and upper arm level, right arm, initial encounter: Secondary | ICD-10-CM

## 2020-09-01 MED ORDER — IBUPROFEN 600 MG PO TABS: 600.0000 mg | ORAL_TABLET | Freq: Three times a day (TID) | ORAL | 0 refills | Status: AC | PRN

## 2020-09-01 NOTE — Discharge Instructions (Addendum)
Advised patient may take ibuprofen daily as needed for right elbow pain.  Advised/encouraged patient may RICE right elbow for 20 minutes 2-3 times a day for the next 3 to 5 days for pain relief as well.

## 2020-09-01 NOTE — ED Triage Notes (Signed)
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

## 2020-09-01 NOTE — ED Provider Notes (Signed)
[name]    CSN: [id number] Arrival date & time: 09/01/20  0915      History   Chief Complaint Chief Complaint  Patient presents with  . Elbow Pain    HPI Megan Mueller is a 30 y.o. female.   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  Past Medical History:  Diagnosis Date  . Allergic rhinitis   . Epilepsy (HCC)   . Epilepsy (HCC)   . Seizures [facility])     Patient Active Problem List   Diagnosis Date Noted  . Annual physical exam 09/01/2015  . Seizure disorder, grand mal (HCC) 09/01/2015    Past Surgical History:  Procedure Laterality Date  . EYE SURGERY Left   . HAND SURGERY Left   . OTHER SURGICAL HISTORY     Vagas Nerve Stimulator implant    OB History   No obstetric history on file.      Home Medications    Prior to Admission medications   Medication Sig Start Date End Date Taking? Authorizing Provider  ibuprofen (ADVIL) 600 MG tablet Take 1 tablet (600 mg total) by mouth every 8 (eight) hours as needed for up to 10 days. 09/01/20 09/11/20 Yes Trevor Iha, FNP  lacosamide (VIMPAT) 200 MG TABS tablet Take 200 mg by mouth 2 (two) times daily.   Yes [provider]  AMITRIPTYLINE HCL PO Take by mouth.    [provider]  ipratropium (ATROVENT) 0.06 % nasal spray Place 2 sprays into both nostrils 4 (four) times daily. 09/24/19   Lurene Shadow, PA-C  lamoTRIgine (LAMICTAL) 150 MG tablet Take 150 mg by mouth daily.    [provider]  sertraline (ZOLOFT) 50 MG tablet Take 50 mg by mouth daily.    [provider]  zonisamide (ZONEGRAN) 100 MG capsule Take 100 mg by mouth daily.    [provider]    Family History Family History  Problem Relation Age of Onset  . Hyperlipidemia Mother   . Hypertension Father   . Diabetes Father     Social History Social History   Tobacco Use  . Smoking  status: Never Smoker  . Smokeless tobacco: Never Used  Vaping Use  . Vaping Use: Never used  Substance Use Topics  . Alcohol use: No  . Drug use: No     Allergies   Antihistamines, diphenhydramine-type; Doxycycline; and Pseudoephedrine   Review of Systems Review of Systems  Constitutional: Negative.   HENT: Negative.   Eyes: Negative.   Respiratory: Negative.   Cardiovascular: Negative.   Gastrointestinal: Negative.   Genitourinary: Negative.   Musculoskeletal: Positive for arthralgias.       Right elbow pain for 1 to 2 weeks  Skin: Negative.   Neurological: Negative.      Physical Exam Triage Vital Signs ED Triage Vitals  Enc Vitals Group     BP 09/01/20 0945 117/74     Pulse Rate 09/01/20 0945 65     Resp 09/01/20 0945 16     Temp 09/01/20 0945 98.2 F (36.8 C)     Temp Source 09/01/20 0945 Oral     SpO2 09/01/20 0945 98 %     Weight 09/01/20 0946 117 lb (53.1 kg)     Height 09/01/20 0946 5\' 5"  (1.651 m)     Head Circumference --      Peak Flow --  Pain Score 09/01/20 0946 7     Pain Loc --      Pain Edu? --      Excl. in GC? --    No data found.  Updated Vital Signs BP 117/74 (BP Location: Right Arm)   Pulse 65   Temp 98.2 F (36.8 C) (Oral)   Resp 16   Ht 5\' 5"  (1.651 m)   Wt 117 lb (53.1 kg)   SpO2 98%   BMI 19.47 kg/m        Physical Exam Vitals and nursing note reviewed.  Constitutional:      Appearance: Normal appearance.  HENT:     Head: Normocephalic and atraumatic.  Eyes:     Extraocular Movements: Extraocular movements intact.     Conjunctiva/sclera: Conjunctivae normal.     Pupils: Pupils are equal, round, and reactive to light.  Cardiovascular:     Rate and Rhythm: Normal rate and regular rhythm.     Pulses: Normal pulses.     Heart sounds: Normal heart sounds.  Pulmonary:     Effort: Pulmonary effort is normal.     Breath sounds: Normal breath sounds.  Musculoskeletal:     Comments: Right elbow: TTP over medial  epicondyle, no soft tissue swelling or deformity noted, FROM with flexion/extension, pronation/supination  Skin:    General: Skin is warm and dry.  Neurological:     General: No focal deficit present.     Mental Status: She is alert and oriented to person, place, and time.  Psychiatric:        Mood and Affect: Mood normal.        Behavior: Behavior normal.      UC Treatments / Results  Labs (all labs ordered are listed, but only abnormal results are displayed) Labs Reviewed - No data to display  EKG   Radiology DG Elbow Complete Right  Result Date: 09/01/2020 CLINICAL DATA:  Right elbow pain EXAM: RIGHT ELBOW - COMPLETE 3+ VIEW COMPARISON:  None. FINDINGS: There is no evidence of fracture, dislocation, or joint effusion. There is no evidence of arthropathy or other focal bone abnormality. Soft tissues are unremarkable. IMPRESSION: Negative. Electronically Signed   By: 09/03/2020 M.D.   On: 09/01/2020 11:00    Procedures Procedures (including critical care time)  Medications Ordered in UC Medications - No data to display  Initial Impression / Assessment and Plan / UC Course  I have reviewed the triage vital signs and the nursing notes.  Pertinent labs & imaging results that were available during my care of the patient were reviewed by me and considered in my medical decision making (see chart for details).     MDM: 1. Right Elbow Pain. 2.  Right elbow strain.  Patient discharged home, hemodynamically stable. Final Clinical Impressions(s) / UC Diagnoses   Final diagnoses:  Right elbow pain  Elbow strain, right, initial encounter     Discharge Instructions     Advised patient may take ibuprofen daily as needed for right elbow pain.  Advised/encouraged patient may RICE right elbow for 20 minutes 2-3 times a day for the next 3 to 5 days for pain relief as well.    ED Prescriptions    Medication Sig Dispense Auth. Provider   ibuprofen (ADVIL) 600 MG tablet Take 1  tablet (600 mg total) by mouth every 8 (eight) hours as needed for up to 10 days. 30 tablet 09/03/2020, FNP     PDMP not reviewed this encounter.  Trevor Iha, FNP 09/01/20 1147

## 2020-09-26 DIAGNOSIS — Z20822 Contact with and (suspected) exposure to covid-19: Secondary | ICD-10-CM | POA: Diagnosis not present

## 2020-09-27 ENCOUNTER — Encounter: Payer: Self-pay | Admitting: Emergency Medicine

## 2020-09-27 ENCOUNTER — Emergency Department
Admission: EM | Admit: 2020-09-27 | Discharge: 2020-09-27 | Disposition: A | Discharge status: Home / Self Care | Payer: 59 | Source: Home / Self Care

## 2020-09-27 ENCOUNTER — Other Ambulatory Visit: Payer: Self-pay

## 2020-09-27 DIAGNOSIS — R42 Dizziness and giddiness: Secondary | ICD-10-CM

## 2020-09-27 MED ORDER — MECLIZINE HCL 25 MG PO TABS: 25.0000 mg | ORAL_TABLET | Freq: Three times a day (TID) | ORAL | 0 refills | Status: AC | PRN

## 2020-09-27 MED ORDER — FLUTICASONE PROPIONATE 50 MCG/ACT NA SUSP: 1.0000 | Freq: Every day | NASAL | 2 refills | Status: DC

## 2020-09-27 NOTE — ED Triage Notes (Signed)
Patient c/o severe dizziness x 2 days.  Patient states that she fell out of her bed and vomited last night.  This has happened before when she took too much of her meds in the past.

## 2020-09-27 NOTE — ED Provider Notes (Signed)
[facility]  ____________________________________________  Time seen: Approximately 1:05 PM  I have reviewed the triage vital signs and the nursing notes.   HISTORY  Chief Complaint Dizziness   Historian Patient     HPI Megan Mueller is a 30 y.o. female with a history of epilepsy, presents to the urgent care with dizziness that occurs from a supine to a sitting position or from a sitting to standing position.  She states that she has had symptoms for the past 2 days and the started while she was riding in the car.  She denies falls or mechanisms of trauma.  She reports that she takes lamotrigine for seizures and can sometimes experiences dizziness when she has had too much of her medication.  She reports that she has had her typical dosages of her medications and she is absolutely certain she has not had more than what is indicated by her PCP.  She denies fever or chills.  No rhinorrhea, nasal congestion or nonproductive cough.  She denies possibility of pregnancy.  Denies experiencing similar symptoms in the past.  Denies headache.  No other alleviating measures have been attempted.   Past Medical History:  Diagnosis Date  . Allergic rhinitis   . Epilepsy (HCC)   . Epilepsy (HCC)   . Seizures (HCC)      Immunizations up to date:  Yes.     Past Medical History:  Diagnosis Date  . Allergic rhinitis   . Epilepsy (HCC)   . Epilepsy (HCC)   . Seizures [facility])     Patient Active Problem List   Diagnosis Date Noted  . Annual physical exam 09/01/2015  . Seizure disorder, grand mal (HCC) 09/01/2015    Past Surgical History:  Procedure Laterality Date  . EYE SURGERY Left   . HAND SURGERY Left   . OTHER SURGICAL HISTORY     Vagas Nerve Stimulator implant    Prior to Admission medications   Medication Sig Start Date End Date Taking? Authorizing Provider  AMITRIPTYLINE HCL PO Take by mouth.   Yes [provider]  fluticasone (FLONASE) 50 MCG/ACT  nasal spray Place 1 spray into both nostrils daily for 7 days. 09/27/20 10/04/20 Yes Pia Mau M, PA-C  ipratropium (ATROVENT) 0.06 % nasal spray Place 2 sprays into both nostrils 4 (four) times daily. 09/24/19  Yes Phelps, Erin O, PA-C  lacosamide (VIMPAT) 200 MG TABS tablet Take 200 mg by mouth 2 (two) times daily.   Yes [provider]  lamoTRIgine (LAMICTAL) 150 MG tablet Take 150 mg by mouth daily.   Yes [provider]  meclizine (ANTIVERT) 25 MG tablet Take 1 tablet (25 mg total) by mouth 3 (three) times daily as needed for up to 5 days for dizziness. 09/27/20 10/02/20 Yes Pia Mau M, PA-C  sertraline (ZOLOFT) 50 MG tablet Take 50 mg by mouth daily.   Yes [provider]  zonisamide (ZONEGRAN) 100 MG capsule Take 100 mg by mouth daily.   Yes [provider]    Allergies Antihistamines, diphenhydramine-type; Doxycycline; and Pseudoephedrine  Family History  Problem Relation Age of Onset  . Hyperlipidemia Mother   . Hypertension Father   . Diabetes Father     Social History Social History   Tobacco Use  . Smoking status: Never Smoker  . Smokeless tobacco: Never Used  Vaping Use  . Vaping Use: Never used  Substance Use Topics  . Alcohol use: No  . Drug use: No     Review  of Systems  Constitutional: No fever/chills Eyes:  No discharge ENT: No upper respiratory complaints. Respiratory: no cough. No SOB/ use of accessory muscles to breath Gastrointestinal:   No nausea, no vomiting.  No diarrhea.  No constipation. Musculoskeletal: Negative for musculoskeletal pain. Skin: Negative for rash, abrasions, lacerations, ecchymosis.    ____________________________________________   PHYSICAL EXAM:  VITAL SIGNS: ED Triage Vitals [09/27/20 1220]  Enc Vitals Group     BP 100/68     Pulse Rate 67     Resp      Temp      Temp src      SpO2 99 %     Weight      Height      Head Circumference      Peak Flow      Pain Score 0     Pain  Loc      Pain Edu?      Excl. in GC?      Constitutional: Alert and oriented. Well appearing and in no acute distress.  Patient's vertigo is reproduced when patient goes from a supine to a sitting position. Eyes: Conjunctivae are normal. PERRL. EOMI. Head: Atraumatic. ENT:      Ears: TMs are effused bilaterally.      Nose: No congestion/rhinnorhea.      Mouth/Throat: Mucous membranes are moist.  Neck: No stridor.  Full range of motion. Cardiovascular: Normal rate, regular rhythm. Normal S1 and S2.  Good peripheral circulation. Respiratory: Normal respiratory effort without tachypnea or retractions. Lungs CTAB. Good air entry to the bases with no decreased or absent breath sounds Gastrointestinal: Bowel sounds x 4 quadrants. Soft and nontender to palpation. No guarding or rigidity. No distention. Musculoskeletal: Full range of motion to all extremities. No obvious deformities noted Neurologic:  Normal for age. No gross focal neurologic deficits are appreciated.  Skin:  Skin is warm, dry and intact. No rash noted. Psychiatric: Mood and affect are normal for age. Speech and behavior are normal.   ____________________________________________   LABS (all labs ordered are listed, but only abnormal results are displayed)  Labs Reviewed - No data to display ____________________________________________  EKG   ____________________________________________  RADIOLOGY   No results found.  ____________________________________________    PROCEDURES  Procedure(s) performed:     Procedures     Medications - No data to display   ____________________________________________   INITIAL IMPRESSION / ASSESSMENT AND PLAN / ED COURSE  Pertinent labs & imaging results that were available during my care of the patient were reviewed by me and considered in my medical decision making (see chart for details).       Assessment and plan Dizziness Eustachian tube  dysfunction 30 year old female presents to the urgent care with dizziness that is reproduced when going from a supine to a sitting position or from a sitting to a standing position.  Patient had no neurodeficits noted on physical exam and history and physical exam findings suggest benign positional vertigo.  Patient was started on meclizine and Flonase.  Flonase is to address middle ear effusion noted on physical exam.  Patient was advised to follow-up with her primary care provider in 1 week if symptoms not improve.  All patient questions were answered.    ____________________________________________  FINAL CLINICAL IMPRESSION(S) / ED DIAGNOSES  Final diagnoses:  Dizziness      NEW MEDICATIONS STARTED DURING THIS VISIT:  ED Discharge Orders         Ordered    meclizine (  ANTIVERT) 25 MG tablet  3 times daily PRN        09/27/20 1302    fluticasone (FLONASE) 50 MCG/ACT nasal spray  Daily        09/27/20 1302              This chart was dictated using voice recognition software/Dragon. Despite best efforts to proofread, errors can occur which can change the meaning. Any change was purely unintentional.     Orvil Feil, PA-C 09/27/20 1308

## 2020-09-27 NOTE — Discharge Instructions (Signed)
Take meclizine and Flonase as directed.

## 2021-02-07 DIAGNOSIS — Z9689 Presence of other specified functional implants: Secondary | ICD-10-CM | POA: Diagnosis not present

## 2021-02-07 DIAGNOSIS — F325 Major depressive disorder, single episode, in full remission: Secondary | ICD-10-CM | POA: Diagnosis not present

## 2021-02-07 DIAGNOSIS — G40319 Generalized idiopathic epilepsy and epileptic syndromes, intractable, without status epilepticus: Secondary | ICD-10-CM | POA: Diagnosis not present

## 2021-02-07 DIAGNOSIS — R69 Illness, unspecified: Secondary | ICD-10-CM | POA: Diagnosis not present

## 2021-03-27 ENCOUNTER — Encounter: Payer: Self-pay | Admitting: Emergency Medicine

## 2021-03-27 ENCOUNTER — Other Ambulatory Visit: Payer: Self-pay

## 2021-03-27 ENCOUNTER — Emergency Department (INDEPENDENT_AMBULATORY_CARE_PROVIDER_SITE_OTHER)
Admission: EM | Admit: 2021-03-27 | Discharge: 2021-03-27 | Disposition: A | Discharge status: Home / Self Care | Payer: 59 | Source: Home / Self Care | Attending: Family Medicine | Admitting: Family Medicine

## 2021-03-27 DIAGNOSIS — R42 Dizziness and giddiness: Secondary | ICD-10-CM | POA: Diagnosis not present

## 2021-03-27 MED ORDER — MECLIZINE HCL 25 MG PO TABS: 25.0000 mg | ORAL_TABLET | Freq: Three times a day (TID) | ORAL | 0 refills | Status: DC | PRN

## 2021-03-27 MED ORDER — ONDANSETRON HCL 8 MG PO TABS: 8.0000 mg | ORAL_TABLET | Freq: Three times a day (TID) | ORAL | 0 refills | Status: DC | PRN

## 2021-03-27 NOTE — Discharge Instructions (Addendum)
[facility] is in [place].  Their number is [phone]  Take a Zofran and meclizine an hour before bedtime Drink lots of fluids  Follow-up with your primary care doctor See ENT if your vertigo spells keep recurring

## 2021-03-27 NOTE — ED Triage Notes (Signed)
Dizziness x 2 days Vaccinated Patient admits her meds make her dizzy in the morning, but she is not used to being dizzy at night. Vomited Sunday night from dizziness. Feels fine today.

## 2021-03-27 NOTE — ED Provider Notes (Signed)
[name]    CSN: [id number] Arrival date & time: 03/27/21  3244      History   Chief Complaint Chief Complaint  Patient presents with   Dizziness    HPI Megan Mueller is a 30 y.o. female.   HPI  Patient has a history of vertigo in the past.  She states since Sunday night she has been having more vertigo.  It happens only at night.  When she lays in bed and tries to rollover she vomits.  .  She does not have any lightheadedness, no fainting, no real problems during the day.  She states that in the evenings she gets very tired and she "feels drunk" and has balance problems.  She is working with her neurologist to make sure that her medication doses are optimal  Past Medical History:  Diagnosis Date   Allergic rhinitis    Epilepsy (HCC)    Epilepsy (HCC)    Seizures (HCC)     Patient Active Problem List   Diagnosis Date Noted   Annual physical exam 09/01/2015   Seizure disorder, grand mal (HCC) 09/01/2015    Past Surgical History:  Procedure Laterality Date   EYE SURGERY Left    HAND SURGERY Left    OTHER SURGICAL HISTORY     Vagas Nerve Stimulator implant    OB History   No obstetric history on file.      Home Medications    Prior to Admission medications   Medication Sig Start Date End Date Taking? Authorizing Provider  meclizine (ANTIVERT) 25 MG tablet Take 1 tablet (25 mg total) by mouth 3 (three) times daily as needed for dizziness. 03/27/21  Yes Eustace Moore, MD  ondansetron (ZOFRAN) 8 MG tablet Take 1 tablet (8 mg total) by mouth every 8 (eight) hours as needed for nausea or vomiting. 03/27/21  Yes Eustace Moore, MD  AMITRIPTYLINE HCL PO Take by mouth.    [provider]  fluticasone (FLONASE) 50 MCG/ACT nasal spray Place 1 spray into both nostrils daily for 7 days. 09/27/20 10/04/20  Orvil Feil, PA-C  ipratropium (ATROVENT) 0.06 % nasal spray Place 2 sprays into both nostrils 4 (four) times daily. 09/24/19   Lurene Shadow, PA-C  lacosamide (VIMPAT) 200 MG TABS tablet Take 200 mg by mouth 2 (two) times daily.    [provider]  lamoTRIgine (LAMICTAL) 150 MG tablet Take 150 mg by mouth daily.    [provider]  sertraline (ZOLOFT) 50 MG tablet Take 50 mg by mouth daily.    [provider]  zonisamide (ZONEGRAN) 100 MG capsule Take 100 mg by mouth daily.    [provider]    Family History Family History  Problem Relation Age of Onset   Hyperlipidemia Mother    Hypertension Father    Diabetes Father     Social History Social History   Tobacco Use   Smoking status: Never   Smokeless tobacco: Never  Vaping Use   Vaping Use: Never used  Substance Use Topics   Alcohol use: No   Drug use: No     Allergies   Antihistamines, diphenhydramine-type; Doxycycline; and Pseudoephedrine   Review of Systems Review of Systems See HPI  Physical Exam Triage Vital Signs ED Triage Vitals  Enc Vitals Group     BP 03/27/21 0939 112/72     Pulse Rate 03/27/21 0939 82     Resp --      Temp 03/27/21  0939 97.6 F (36.4 C)     Temp src --      SpO2 03/27/21 0939 100 %     Weight 03/27/21 0943 114 lb (51.7 kg)     Height 03/27/21 0943 5\' 5"  (1.651 m)     Head Circumference --      Peak Flow --      Pain Score 03/27/21 0942 0     Pain Loc --      Pain Edu? --      Excl. in GC? --    Orthostatic VS for the past 24 hrs:  BP- Lying Pulse- Lying BP- Sitting Pulse- Sitting BP- Standing at 0 minutes Pulse- Standing at 0 minutes  03/27/21 0934 107/67 64 109/74 72 114/77 82    Updated Vital Signs BP 112/72 (BP Location: Left Arm)   Pulse 82   Temp 97.6 F (36.4 C)   Ht 5\' 5"  (1.651 m)   Wt 51.7 kg   SpO2 100%   BMI 18.97 kg/m     Physical Exam Constitutional:      General: She is not in acute distress.    Appearance: Normal appearance. She is well-developed and normal weight.  HENT:     Head: Normocephalic and atraumatic.     Right Ear: Tympanic  membrane and ear canal normal.     Left Ear: Tympanic membrane and ear canal normal.     Nose: Nose normal. No congestion.     Mouth/Throat:     Mouth: Mucous membranes are moist.     Pharynx: No posterior oropharyngeal erythema.  Eyes:     Conjunctiva/sclera: Conjunctivae normal.     Pupils: Pupils are equal, round, and reactive to light.     Comments: No nystagmus  Cardiovascular:     Rate and Rhythm: Normal rate.  Pulmonary:     Effort: Pulmonary effort is normal. No respiratory distress.  Abdominal:     General: There is no distension.     Palpations: Abdomen is soft.  Musculoskeletal:        General: Normal range of motion.     Cervical back: Normal range of motion.  Lymphadenopathy:     Cervical: No cervical adenopathy.  Skin:    General: Skin is warm and dry.  Neurological:     General: No focal deficit present.     Mental Status: She is alert.     Cranial Nerves: No cranial nerve deficit.     Motor: No weakness.     Coordination: Coordination normal.     Gait: Gait normal.     Deep Tendon Reflexes: Reflexes normal.  Psychiatric:        Mood and Affect: Mood normal.     UC Treatments / Results  Labs (all labs ordered are listed, but only abnormal results are displayed) Labs Reviewed - No data to display  EKG   Radiology No results found.  Procedures Procedures (including critical care time)  Medications Ordered in UC Medications - No data to display  Initial Impression / Assessment and Plan / UC Course  I have reviewed the triage vital signs and the nursing notes.  Pertinent labs & imaging results that were available during my care of the patient were reviewed by me and considered in my medical decision making (see chart for details).     Physical examination is presentingBenign.  History is classic for positional vertigo.  Do not suspect alternate diagnosis.  Recommend following up with ENT if she  continues to have vertigo recurring Final Clinical  Impressions(s) / UC Diagnoses   Final diagnoses:  Vertigo     Discharge Instructions      [facility] is in [place].  Their number is [phone]  Take a Zofran and meclizine an hour before bedtime Drink lots of fluids  Follow-up with your primary care doctor See ENT if your vertigo spells keep recurring     ED Prescriptions     Medication Sig Dispense Auth. Provider   meclizine (ANTIVERT) 25 MG tablet Take 1 tablet (25 mg total) by mouth 3 (three) times daily as needed for dizziness. 30 tablet Eustace Moore, MD   ondansetron (ZOFRAN) 8 MG tablet Take 1 tablet (8 mg total) by mouth every 8 (eight) hours as needed for nausea or vomiting. 20 tablet Eustace Moore, MD      PDMP not reviewed this encounter.   Eustace Moore, MD 03/27/21 [phone]

## 2021-04-25 ENCOUNTER — Other Ambulatory Visit: Payer: Self-pay

## 2021-04-25 ENCOUNTER — Emergency Department
Admission: EM | Admit: 2021-04-25 | Discharge: 2021-04-25 | Disposition: A | Discharge status: Home / Self Care | Payer: Self-pay | Source: Home / Self Care

## 2021-04-25 ENCOUNTER — Emergency Department
Admission: EM | Admit: 2021-04-25 | Discharge: 2021-04-25 | Disposition: A | Discharge status: Home / Self Care | Payer: 59 | Source: Home / Self Care

## 2021-04-25 DIAGNOSIS — R4182 Altered mental status, unspecified: Secondary | ICD-10-CM

## 2021-04-25 LAB — POCT FASTING CBG KUC MANUAL ENTRY: POCT Glucose (KUC): 118 mg/dL — AB (ref 70–99)

## 2021-04-25 NOTE — ED Notes (Signed)
Patient is being discharged from the Urgent Care and sent to the Emergency Department via POV . Per M. Ragan, FNP, patient is in need of higher level of care due to altered mental status/slurred speech. Patient is aware and verbalizes understanding of plan of care.  Vitals:   04/25/21 0931  BP: 111/74  Pulse: (!) 108  Resp: 16  Temp: 97.9 F (36.6 C)  SpO2: 100%

## 2021-04-25 NOTE — ED Triage Notes (Signed)
Pt presents with AMS and dizziness x15 minutes

## 2021-04-25 NOTE — Discharge Instructions (Addendum)
Advised patient and Father to go to [facility] ED now for immediate evaluation of altered mental status.  Father agreed and verbalized understanding of these instructions and this plan of care today.

## 2021-04-25 NOTE — ED Provider Notes (Signed)
[name]    CSN: [id number] Arrival date & time: 04/25/21  [id number]      History   Chief Complaint Chief Complaint  Patient presents with   Altered Mental Status    HPI Megan Mueller is a 31 y.o. female.   HPI 31 year old female presents with altered mental status and dizziness for the past 15 to 30 minutes.  Patient is brought here by her Father and has PMH of epilepsy and seizure disorder.  Patient was incoherent, mild difficulty when answering questions with slurred speech.  Patient is alert and oriented x 3 after several minutes of repeating questions to her.  Patient continued to complain of dizziness and weakness. Past Medical History:  Diagnosis Date   Allergic rhinitis    Epilepsy ([place])    Epilepsy ([place])    Seizures ([place])     Patient Active Problem List   Diagnosis Date Noted   Annual physical exam 09/01/2015   Seizure disorder, grand mal ([place]) 09/01/2015    Past Surgical History:  Procedure Laterality Date   EYE SURGERY Left    HAND SURGERY Left    OTHER SURGICAL HISTORY     Vagas Nerve Stimulator implant    OB History   No obstetric history on file.      Home Medications    Prior to Admission medications   Medication Sig Start Date End Date Taking? Authorizing Provider  AMITRIPTYLINE HCL PO Take by mouth.    [provider]  fluticasone (FLONASE) 50 MCG/ACT nasal spray Place 1 spray into both nostrils daily for 7 days. 09/27/20 10/04/20  Lannie Fields, PA-C  ipratropium (ATROVENT) 0.06 % nasal spray Place 2 sprays into both nostrils 4 (four) times daily. 09/24/19   Noe Gens, PA-C  lacosamide (VIMPAT) 200 MG TABS tablet Take 200 mg by mouth 2 (two) times daily.    [provider]  lamoTRIgine (LAMICTAL) 150 MG tablet Take 150 mg by mouth daily.    [provider]  meclizine (ANTIVERT) 25 MG tablet Take 1 tablet (25 mg total) by mouth 3 (three) times daily as needed for dizziness. 03/27/21   Raylene Everts,  MD  ondansetron (ZOFRAN) 8 MG tablet Take 1 tablet (8 mg total) by mouth every 8 (eight) hours as needed for nausea or vomiting. 03/27/21   Raylene Everts, MD  sertraline (ZOLOFT) 50 MG tablet Take 50 mg by mouth daily.    [provider]  zonisamide (ZONEGRAN) 100 MG capsule Take 100 mg by mouth daily.    [provider]    Family History Family History  Problem Relation Age of Onset   Hyperlipidemia Mother    Hypertension Father    Diabetes Father     Social History Social History   Tobacco Use   Smoking status: Never   Smokeless tobacco: Never  Vaping Use   Vaping Use: Never used  Substance Use Topics   Alcohol use: No   Drug use: No     Allergies   Antihistamines, diphenhydramine-type; Doxycycline; and Pseudoephedrine   Review of Systems Review of Systems  Neurological:  Positive for dizziness and weakness.  Psychiatric/Behavioral:  Positive for confusion.   All other systems reviewed and are negative.   Physical Exam Triage Vital Signs ED Triage Vitals  Enc Vitals Group     BP      Pulse      Resp      Temp      Temp src  SpO2      Weight      Height      Head Circumference      Peak Flow      Pain Score      Pain Loc      Pain Edu?      Excl. in [place]?    No data found.  Updated Vital Signs BP 111/74 (BP Location: Right Arm)    Pulse (!) 108    Temp 97.9 F (36.6 C) (Oral)    Resp 16    Wt 114 lb (51.7 kg)    SpO2 100%    BMI 18.97 kg/m   Physical Exam Vitals and nursing note reviewed.  Constitutional:      Appearance: She is normal weight. She is ill-appearing.  HENT:     Head: Normocephalic and atraumatic.     Right Ear: Tympanic membrane, ear canal and external ear normal.     Left Ear: Tympanic membrane, ear canal and external ear normal.     Mouth/Throat:     Mouth: Mucous membranes are moist.     Pharynx: Oropharynx is clear.  Eyes:     Extraocular Movements: Extraocular movements intact.      Conjunctiva/sclera: Conjunctivae normal.     Pupils: Pupils are equal, round, and reactive to light.  Neck:     Comments: No JVD, no bruit Cardiovascular:     Rate and Rhythm: Regular rhythm. Tachycardia present.     Pulses: Normal pulses.     Heart sounds: Normal heart sounds. No murmur heard.   No friction rub. No gallop.  Pulmonary:     Effort: Pulmonary effort is normal.     Breath sounds: Normal breath sounds.  Musculoskeletal:     Cervical back: Normal range of motion and neck supple. No rigidity.  Lymphadenopathy:     Cervical: No cervical adenopathy.  Skin:    General: Skin is warm and dry.  Neurological:     General: No focal deficit present.     Mental Status: She is alert and oriented to person, place, and time.     Cranial Nerves: No cranial nerve deficit.     Sensory: No sensory deficit.     Motor: Weakness present.     UC Treatments / Results  Labs (all labs ordered are listed, but only abnormal results are displayed) Labs Reviewed  POCT FASTING CBG [facility] MANUAL ENTRY - Abnormal; Notable for the following components:      Result Value   POCT Glucose ([facility]) 118 (*)    All other components within normal limits    EKG   Radiology No results found.  Procedures Procedures (including critical care time)  Medications Ordered in UC Medications - No data to display  Initial Impression / Assessment and Plan / UC Course  I have reviewed the triage vital signs and the nursing notes.  Pertinent labs & imaging results that were available during my care of the patient were reviewed by me and considered in my medical decision making (see chart for details).     MDM: 1.  Altered mental status-Advised patient and Father to go to [facility] ED now for immediate evaluation of altered mental status.  Patient discharged,, hemodynamically stable patient transported via wheelchair to front door where her father will take her to [facility] now as  advised for immediate evaluation of altered mental status. Final Clinical Impressions(s) / UC Diagnoses   Final diagnoses:  Altered mental  status, unspecified altered mental status type     Discharge Instructions      Advised patient and Father to go to [facility] ED now for immediate evaluation of altered mental status.  Father agreed and verbalized understanding of these instructions and this plan of care today.     ED Prescriptions   None    PDMP not reviewed this encounter.   Eliezer Lofts, [place] 04/25/21 [phone]

## 2022-03-07 ENCOUNTER — Ambulatory Visit
Admission: EM | Admit: 2022-03-07 | Discharge: 2022-03-07 | Disposition: A | Discharge status: Home / Self Care | Payer: BC Managed Care – PPO | Attending: Family Medicine | Admitting: Family Medicine

## 2022-03-07 DIAGNOSIS — R051 Acute cough: Secondary | ICD-10-CM | POA: Diagnosis not present

## 2022-03-07 DIAGNOSIS — J069 Acute upper respiratory infection, unspecified: Secondary | ICD-10-CM | POA: Diagnosis not present

## 2022-03-07 MED ORDER — PREDNISONE 20 MG PO TABS: 20.0000 mg | ORAL_TABLET | Freq: Two times a day (BID) | ORAL | 0 refills | Status: DC

## 2022-03-07 MED ORDER — BENZONATATE 200 MG PO CAPS: 200.0000 mg | ORAL_CAPSULE | Freq: Two times a day (BID) | ORAL | 0 refills | Status: DC | PRN

## 2022-03-07 NOTE — ED Triage Notes (Addendum)
Pt presents to Urgent Care with c/o sore throat x 6 days, nasal congestion x 2 days, and cough since yesterday. Reports a 5-minute episode of being "unable to breathe" today; reports her throat felt like it was swelling shut. No acute respiratory distress. No COVID test.

## 2022-03-07 NOTE — ED Provider Notes (Signed)
[facility]    CSN: [id number] Arrival date & time: 03/07/22  1809      History   Chief Complaint Chief Complaint  Patient presents with   Cough   Nasal Congestion   Sore Throat    HPI Megan Mueller is a 31 y.o. female.   HPI Megan Mueller is here for an upper respiratory infection.  She started out with some nasal congestion and sore throat.  Symptoms got better, and for the last couple of days she has had cough.  The cough is getting worse.  Today she had 2 coughing spells that were so severe she felt like she would stop breathing.  She almost fainted.  She states her chest got tight.  No history of asthma.  No history of Smoking Past Medical History:  Diagnosis Date   Allergic rhinitis    Epilepsy ([facility])    Epilepsy ([facility])    Seizures ([facility])     Patient Active Problem List   Diagnosis Date Noted   Annual physical exam 09/01/2015   Seizure disorder, grand mal ([facility]) 09/01/2015    Past Surgical History:  Procedure Laterality Date   EYE SURGERY Left    HAND SURGERY Left    OTHER SURGICAL HISTORY     Vagas Nerve Stimulator implant    OB History   No obstetric history on file.      Home Medications    Prior to Admission medications   Medication Sig Start Date End Date Taking? Authorizing Provider  benzonatate (TESSALON) 200 MG capsule Take 1 capsule (200 mg total) by mouth 2 (two) times daily as needed for cough. 03/07/22  Yes Eustace Moore, MD  predniSONE (DELTASONE) 20 MG tablet Take 1 tablet (20 mg total) by mouth 2 (two) times daily with a meal. 03/07/22  Yes Eustace Moore, MD  pseudoephedrine (SUDAFED) 30 MG tablet Take 30 mg by mouth every 4 (four) hours as needed for congestion.   Yes [provider]  AMITRIPTYLINE HCL PO Take by mouth.    [provider]  fluticasone (FLONASE) 50 MCG/ACT nasal spray Place 1 spray into both nostrils daily for 7 days. 09/27/20 10/04/20  Orvil Feil, PA-C  ipratropium (ATROVENT) 0.06 %  nasal spray Place 2 sprays into both nostrils 4 (four) times daily. 09/24/19   Lurene Shadow, PA-C  lacosamide (VIMPAT) 200 MG TABS tablet Take 200 mg by mouth 2 (two) times daily.    [provider]  lamoTRIgine (LAMICTAL) 150 MG tablet Take 150 mg by mouth daily.    [provider]  meclizine (ANTIVERT) 25 MG tablet Take 1 tablet (25 mg total) by mouth 3 (three) times daily as needed for dizziness. 03/27/21   Eustace Moore, MD  ondansetron (ZOFRAN) 8 MG tablet Take 1 tablet (8 mg total) by mouth every 8 (eight) hours as needed for nausea or vomiting. 03/27/21   Eustace Moore, MD  sertraline (ZOLOFT) 50 MG tablet Take 50 mg by mouth daily.    [provider]  zonisamide (ZONEGRAN) 100 MG capsule Take 100 mg by mouth daily.    [provider]    Family History Family History  Problem Relation Age of Onset   Hyperlipidemia Mother    Hypertension Father    Diabetes Father     Social History Social History   Tobacco Use   Smoking status: Never   Smokeless tobacco: Never  Vaping Use   Vaping Use: Never used  Substance Use  Topics   Alcohol use: Yes    Comment: socially   Drug use: No     Allergies   Antihistamines, diphenhydramine-type; Doxycycline; and Pseudoephedrine   Review of Systems Review of Systems See HPI  Physical Exam Triage Vital Signs ED Triage Vitals  Enc Vitals Group     BP 03/07/22 1858 126/81     Pulse Rate 03/07/22 1858 73     Resp 03/07/22 1858 20     Temp 03/07/22 1858 99.3 F (37.4 C)     Temp Source 03/07/22 1858 Oral     SpO2 03/07/22 1858 100 %     Weight 03/07/22 1854 120 lb (54.4 kg)     Height 03/07/22 1854 5\' 5"  (1.651 m)     Head Circumference --      Peak Flow --      Pain Score 03/07/22 1853 0     Pain Loc --      Pain Edu? --      Excl. in [place]? --    No data found.  Updated Vital Signs BP 126/81 (BP Location: Left Arm)   Pulse 73   Temp 99.3 F (37.4 C) (Oral)   Resp 20   Ht 5\' 5"   (1.651 m)   Wt 54.4 kg   LMP 02/03/2022   SpO2 100%   BMI 19.97 kg/m       Physical Exam Constitutional:      General: She is not in acute distress.    Appearance: She is well-developed and normal weight. She is not ill-appearing.  HENT:     Head: Normocephalic and atraumatic.     Right Ear: Tympanic membrane and ear canal normal.     Left Ear: Tympanic membrane and ear canal normal.     Nose: Congestion present.     Mouth/Throat:     Mouth: Mucous membranes are moist.     Pharynx: No posterior oropharyngeal erythema.     Tonsils: No tonsillar exudate.  Eyes:     Conjunctiva/sclera: Conjunctivae normal.     Pupils: Pupils are equal, round, and reactive to light.  Cardiovascular:     Rate and Rhythm: Normal rate and regular rhythm.     Heart sounds: Normal heart sounds.  Pulmonary:     Effort: Pulmonary effort is normal. No respiratory distress.     Breath sounds: No wheezing.     Comments: Faint end inspiratory wheeze with cough Abdominal:     General: There is no distension.     Palpations: Abdomen is soft.  Musculoskeletal:        General: Normal range of motion.     Cervical back: Normal range of motion.  Skin:    General: Skin is warm and dry.  Neurological:     Mental Status: She is alert.  Psychiatric:        Mood and Affect: Mood normal.        Behavior: Behavior normal.      UC Treatments / Results  Labs (all labs ordered are listed, but only abnormal results are displayed) Labs Reviewed - No data to display  EKG   Radiology No results found.  Procedures Procedures (including critical care time)  Medications Ordered in UC Medications - No data to display  Initial Impression / Assessment and Plan / UC Course  I have reviewed the triage vital signs and the nursing notes.  Pertinent labs & imaging results that were available during my care of the patient were  reviewed by me and considered in my medical decision making (see chart for  details).     Final Clinical Impressions(s) / UC Diagnoses   Final diagnoses:  Acute cough  Viral URI with cough     Discharge Instructions      Make sure you are drinking lots of fluids Run a humidifier in the bedroom if 1 is available Take the prednisone 2 a day for 5 days May continue your Mucinex DM or over-the-counter cough medicine Add Tessalon 1 pill 2 times a day See your doctor if not improving by next week   ED Prescriptions     Medication Sig Dispense Auth. Provider   predniSONE (DELTASONE) 20 MG tablet Take 1 tablet (20 mg total) by mouth 2 (two) times daily with a meal. 10 tablet Raylene Everts, MD   benzonatate (TESSALON) 200 MG capsule Take 1 capsule (200 mg total) by mouth 2 (two) times daily as needed for cough. 20 capsule Raylene Everts, MD      PDMP not reviewed this encounter.   Raylene Everts, MD 03/07/22 1950

## 2022-03-07 NOTE — Discharge Instructions (Signed)
Make sure you are drinking lots of fluids Run a humidifier in the bedroom if 1 is available Take the prednisone 2 a day for 5 days May continue your Mucinex DM or over-the-counter cough medicine Add Tessalon 1 pill 2 times a day See your doctor if not improving by next week

## 2022-03-08 ENCOUNTER — Telehealth: Payer: Self-pay | Admitting: Emergency Medicine

## 2022-04-25 DIAGNOSIS — R42 Dizziness and giddiness: Secondary | ICD-10-CM | POA: Diagnosis not present

## 2022-05-28 DIAGNOSIS — L650 Telogen effluvium: Secondary | ICD-10-CM | POA: Diagnosis not present

## 2022-08-21 DIAGNOSIS — G40319 Generalized idiopathic epilepsy and epileptic syndromes, intractable, without status epilepticus: Secondary | ICD-10-CM | POA: Diagnosis not present

## 2022-09-20 ENCOUNTER — Ambulatory Visit
Admission: EM | Admit: 2022-09-20 | Discharge: 2022-09-20 | Disposition: A | Discharge status: Home / Self Care | Payer: 59 | Attending: Family Medicine | Admitting: Family Medicine

## 2022-09-20 DIAGNOSIS — R0981 Nasal congestion: Secondary | ICD-10-CM | POA: Diagnosis not present

## 2022-09-20 DIAGNOSIS — R42 Dizziness and giddiness: Secondary | ICD-10-CM

## 2022-09-20 DIAGNOSIS — J0100 Acute maxillary sinusitis, unspecified: Secondary | ICD-10-CM

## 2022-09-20 DIAGNOSIS — J309 Allergic rhinitis, unspecified: Secondary | ICD-10-CM

## 2022-09-20 MED ORDER — MECLIZINE HCL 25 MG PO TABS: 25.0000 mg | ORAL_TABLET | Freq: Three times a day (TID) | ORAL | 0 refills | Status: DC | PRN

## 2022-09-20 MED ORDER — AMOXICILLIN-POT CLAVULANATE 875-125 MG PO TABS: 1.0000 | ORAL_TABLET | Freq: Two times a day (BID) | ORAL | 0 refills | Status: AC

## 2022-09-20 MED ORDER — FEXOFENADINE HCL 180 MG PO TABS: 180.0000 mg | ORAL_TABLET | Freq: Every day | ORAL | 0 refills | Status: DC

## 2022-09-20 MED ORDER — PREDNISONE 20 MG PO TABS: ORAL_TABLET | ORAL | 0 refills | Status: DC

## 2022-09-20 NOTE — ED Triage Notes (Signed)
Pt presents to uc with co of sinus pain and pressure since Wednesday and since then she has been having vertigo. Pt reports she takes epilepsy medications and has been on a new dose for 2 weeks.

## 2022-09-20 NOTE — Discharge Instructions (Addendum)
Instructed patient to take medication as directed with food to completion.  Advised patient to take prednisone and Allegra with first dose of Augmentin for the next 5 of 10 days.  Advised may use Allegra as needed afterwards for concurrent postnasal drainage/drip.  Advised may use meclizine daily or as needed for vertigo.  Encouraged increase daily water intake to 64 ounces per day while taking these medications.  Advised if symptoms worsen and/or unresolved please follow-up with neurology, ENT, or here for further evaluation.

## 2022-09-20 NOTE — ED Provider Notes (Signed)
[name]    CSN: [id number] Arrival date & time: 09/20/22  1355      History   Chief Complaint Chief Complaint  Patient presents with   Dizziness   Facial Pain    HPI Megan Mueller is a 32 y.o. female.   HPI VERY PLEASANT 4-YEAR-OLD FEMALE PRESENTS WITH SINUS NASAL CONGESTION, SINUS PAIN AND PRESSURE FOR 8 DAYS.  PATIENT REPORTS HAS BEEN DEALING WITH VERTIGO AS WELL.  PATIENT IS ACCOMPANIED BY HER FATHER TODAY.  REPORTS  MECLIZINE HAS BEEN PRESCRIBED TO HER IN THE PAST WHICH HAS HELPED WITH DIZZINESS.  PMH significant for epilepsy and seizures.  Past Medical History:  Diagnosis Date   Allergic rhinitis    Epilepsy ([facility])    Epilepsy ([facility])    Seizures ([facility])     Patient Active Problem List   Diagnosis Date Noted   Annual physical exam 09/01/2015   Seizure disorder, grand mal ([facility]) 09/01/2015    Past Surgical History:  Procedure Laterality Date   EYE SURGERY Left    HAND SURGERY Left    OTHER SURGICAL HISTORY     Vagas Nerve Stimulator implant    OB History   No obstetric history on file.      Home Medications    Prior to Admission medications   Medication Sig Start Date End Date Taking? Authorizing Provider  amoxicillin-clavulanate (AUGMENTIN) 875-125 MG tablet Take 1 tablet by mouth 2 (two) times daily for 10 days. 09/20/22 09/30/22 Yes Trevor Iha, FNP  fexofenadine [facility] ALLERGY) 180 MG tablet Take 1 tablet (180 mg total) by mouth daily for 15 days. 09/20/22 10/05/22 Yes Trevor Iha, FNP  meclizine (ANTIVERT) 25 MG tablet Take 1 tablet (25 mg total) by mouth 3 (three) times daily as needed for dizziness. 09/20/22  Yes Trevor Iha, FNP  predniSONE (DELTASONE) 20 MG tablet Take 3 tabs PO daily x 5 days. 09/20/22  Yes Trevor Iha, FNP  AMITRIPTYLINE HCL PO Take by mouth.    [provider]  benzonatate (TESSALON) 200 MG capsule Take 1 capsule (200 mg total) by mouth 2 (two) times daily as needed for cough. 03/07/22   Eustace Moore, MD  fluticasone (FLONASE) 50 MCG/ACT nasal spray Place 1 spray into both nostrils daily for 7 days. 09/27/20 10/04/20  Orvil Feil, PA-C  ipratropium (ATROVENT) 0.06 % nasal spray Place 2 sprays into both nostrils 4 (four) times daily. 09/24/19   Lurene Shadow, PA-C  lacosamide (VIMPAT) 200 MG TABS tablet Take 200 mg by mouth 2 (two) times daily.    [provider]  lamoTRIgine (LAMICTAL) 150 MG tablet Take 150 mg by mouth daily.    [provider]  ondansetron (ZOFRAN) 8 MG tablet Take 1 tablet (8 mg total) by mouth every 8 (eight) hours as needed for nausea or vomiting. 03/27/21   Eustace Moore, MD  pseudoephedrine (SUDAFED) 30 MG tablet Take 30 mg by mouth every 4 (four) hours as needed for congestion.    [provider]  sertraline (ZOLOFT) 50 MG tablet Take 50 mg by mouth daily.    [provider]  zonisamide (ZONEGRAN) 100 MG capsule Take 100 mg by mouth daily.    [provider]    Family History Family History  Problem Relation Age of Onset   Hyperlipidemia Mother    Hypertension Father    Diabetes Father     Social History Social History   Tobacco Use   Smoking status: Never  Smokeless tobacco: Never  Vaping Use   Vaping Use: Never used  Substance Use Topics   Alcohol use: Yes    Comment: socially   Drug use: No     Allergies   Antihistamines, diphenhydramine-type; Doxycycline; and Pseudoephedrine   Review of Systems Review of Systems  HENT:  Positive for congestion, postnasal drip, sinus pressure and sinus pain.   All other systems reviewed and are negative.    Physical Exam Triage Vital Signs ED Triage Vitals  Enc Vitals Group     BP      Pulse      Resp      Temp      Temp src      SpO2      Weight      Height      Head Circumference      Peak Flow      Pain Score      Pain Loc      Pain Edu?      Excl. in [place]?    No data found.  Updated Vital Signs BP 115/79   Pulse 80   Temp  (!) 97.4 F (36.3 C)   Resp 16   LMP 08/28/2022   SpO2 98%    Physical Exam Vitals and nursing note reviewed.  Constitutional:      Appearance: Normal appearance. She is normal weight. She is ill-appearing.  HENT:     Head: Normocephalic and atraumatic.     Right Ear: Tympanic membrane and external ear normal.     Left Ear: Tympanic membrane and external ear normal.     Ears:     Comments: Significant eustachian tube dysfunction noted bilaterally    Nose:     Comments: Turbinates are erythematous/edematous    Mouth/Throat:     Mouth: Mucous membranes are moist.     Pharynx: Oropharynx is clear.     Comments: Significant amount of clear drainage of posterior oropharynx noted with concurrent cobblestoning Eyes:     Extraocular Movements: Extraocular movements intact.     Conjunctiva/sclera: Conjunctivae normal.     Pupils: Pupils are equal, round, and reactive to light.  Cardiovascular:     Rate and Rhythm: Normal rate and regular rhythm.     Pulses: Normal pulses.     Heart sounds: Normal heart sounds.  Pulmonary:     Effort: Pulmonary effort is normal.     Breath sounds: Normal breath sounds. No wheezing, rhonchi or rales.  Musculoskeletal:        General: Normal range of motion.     Cervical back: Normal range of motion and neck supple.  Skin:    General: Skin is warm and dry.  Neurological:     General: No focal deficit present.     Mental Status: She is alert and oriented to person, place, and time. Mental status is at baseline.      UC Treatments / Results  Labs (all labs ordered are listed, but only abnormal results are displayed) Labs Reviewed - No data to display  EKG   Radiology No results found.  Procedures Procedures (including critical care time)  Medications Ordered in UC Medications - No data to display  Initial Impression / Assessment and Plan / UC Course  I have reviewed the triage vital signs and the nursing notes.  Pertinent labs &  imaging results that were available during my care of the patient were reviewed by me and considered in my medical  decision making (see chart for details).     MDM: 1.  Acute maxillary sinusitis, recurrence not specified-Rx'd Augmentin 875/125 mg tablet twice daily x 10 days; 2.  Congestion of nasal sinus-Rx'd prednisone 60 mg daily x 5 days; 3.  Allergic rhinitis, unspecified seasonality, unspecified trigger-Rx'd Allegra 180 mg fexofenadine x 5 days, as needed; 4.  Vertigo-Rx'd meclizine 25 mg tablet 3 times daily, as needed for dizziness. Instructed patient to take medication as directed with food to completion.  Advised patient to take prednisone and Allegra with first dose of Augmentin for the next 5 of 10 days.  Advised may use Allegra as needed afterwards for concurrent postnasal drainage/drip.  Advised may use meclizine daily or as needed for vertigo.  Encouraged increase daily water intake to 64 ounces per day while taking these medications.  Advised if symptoms worsen and/or unresolved please follow-up with neurology, ENT, or here for further evaluation.  Patient discharged home, hemodynamically stable.  Final Clinical Impressions(s) / UC Diagnoses   Final diagnoses:  Vertigo  Acute maxillary sinusitis, recurrence not specified  Allergic rhinitis, unspecified seasonality, unspecified trigger  Congestion of nasal sinus     Discharge Instructions      Instructed patient to take medication as directed with food to completion.  Advised patient to take prednisone and Allegra with first dose of Augmentin for the next 5 of 10 days.  Advised may use Allegra as needed afterwards for concurrent postnasal drainage/drip.  Advised may use meclizine daily or as needed for vertigo.  Encouraged increase daily water intake to 64 ounces per day while taking these medications.  Advised if symptoms worsen and/or unresolved please follow-up with neurology, ENT, or here for further evaluation.     ED  Prescriptions     Medication Sig Dispense Auth. Provider   meclizine (ANTIVERT) 25 MG tablet Take 1 tablet (25 mg total) by mouth 3 (three) times daily as needed for dizziness. 30 tablet Trevor Iha, FNP   amoxicillin-clavulanate (AUGMENTIN) 875-125 MG tablet Take 1 tablet by mouth 2 (two) times daily for 10 days. 20 tablet Trevor Iha, FNP   predniSONE (DELTASONE) 20 MG tablet Take 3 tabs PO daily x 5 days. 15 tablet Trevor Iha, FNP   fexofenadine [facility] ALLERGY) 180 MG tablet Take 1 tablet (180 mg total) by mouth daily for 15 days. 15 tablet Trevor Iha, FNP      PDMP not reviewed this encounter.   Trevor Iha, FNP 09/20/22 1459

## 2022-09-24 DIAGNOSIS — G40319 Generalized idiopathic epilepsy and epileptic syndromes, intractable, without status epilepticus: Secondary | ICD-10-CM | POA: Diagnosis not present

## 2022-09-29 DIAGNOSIS — N760 Acute vaginitis: Secondary | ICD-10-CM | POA: Diagnosis not present

## 2022-10-01 DIAGNOSIS — M4724 Other spondylosis with radiculopathy, thoracic region: Secondary | ICD-10-CM | POA: Diagnosis not present

## 2022-10-01 DIAGNOSIS — M9902 Segmental and somatic dysfunction of thoracic region: Secondary | ICD-10-CM | POA: Diagnosis not present

## 2022-10-01 DIAGNOSIS — M9904 Segmental and somatic dysfunction of sacral region: Secondary | ICD-10-CM | POA: Diagnosis not present

## 2022-10-01 DIAGNOSIS — M9903 Segmental and somatic dysfunction of lumbar region: Secondary | ICD-10-CM | POA: Diagnosis not present

## 2022-10-01 DIAGNOSIS — M9901 Segmental and somatic dysfunction of cervical region: Secondary | ICD-10-CM | POA: Diagnosis not present

## 2022-10-01 DIAGNOSIS — M4721 Other spondylosis with radiculopathy, occipito-atlanto-axial region: Secondary | ICD-10-CM | POA: Diagnosis not present

## 2022-10-01 DIAGNOSIS — M4726 Other spondylosis with radiculopathy, lumbar region: Secondary | ICD-10-CM | POA: Diagnosis not present

## 2022-10-01 DIAGNOSIS — M4728 Other spondylosis with radiculopathy, sacral and sacrococcygeal region: Secondary | ICD-10-CM | POA: Diagnosis not present

## 2022-10-10 DIAGNOSIS — M4728 Other spondylosis with radiculopathy, sacral and sacrococcygeal region: Secondary | ICD-10-CM | POA: Diagnosis not present

## 2022-10-10 DIAGNOSIS — M4724 Other spondylosis with radiculopathy, thoracic region: Secondary | ICD-10-CM | POA: Diagnosis not present

## 2022-10-10 DIAGNOSIS — M9904 Segmental and somatic dysfunction of sacral region: Secondary | ICD-10-CM | POA: Diagnosis not present

## 2022-10-10 DIAGNOSIS — M9902 Segmental and somatic dysfunction of thoracic region: Secondary | ICD-10-CM | POA: Diagnosis not present

## 2022-10-10 DIAGNOSIS — M4721 Other spondylosis with radiculopathy, occipito-atlanto-axial region: Secondary | ICD-10-CM | POA: Diagnosis not present

## 2022-10-10 DIAGNOSIS — M9903 Segmental and somatic dysfunction of lumbar region: Secondary | ICD-10-CM | POA: Diagnosis not present

## 2022-10-10 DIAGNOSIS — M9901 Segmental and somatic dysfunction of cervical region: Secondary | ICD-10-CM | POA: Diagnosis not present

## 2022-10-10 DIAGNOSIS — M4726 Other spondylosis with radiculopathy, lumbar region: Secondary | ICD-10-CM | POA: Diagnosis not present

## 2022-10-15 DIAGNOSIS — M4724 Other spondylosis with radiculopathy, thoracic region: Secondary | ICD-10-CM | POA: Diagnosis not present

## 2022-10-15 DIAGNOSIS — M9901 Segmental and somatic dysfunction of cervical region: Secondary | ICD-10-CM | POA: Diagnosis not present

## 2022-10-15 DIAGNOSIS — M9904 Segmental and somatic dysfunction of sacral region: Secondary | ICD-10-CM | POA: Diagnosis not present

## 2022-10-15 DIAGNOSIS — M9902 Segmental and somatic dysfunction of thoracic region: Secondary | ICD-10-CM | POA: Diagnosis not present

## 2022-10-15 DIAGNOSIS — M4728 Other spondylosis with radiculopathy, sacral and sacrococcygeal region: Secondary | ICD-10-CM | POA: Diagnosis not present

## 2022-10-15 DIAGNOSIS — M4726 Other spondylosis with radiculopathy, lumbar region: Secondary | ICD-10-CM | POA: Diagnosis not present

## 2022-10-15 DIAGNOSIS — M9903 Segmental and somatic dysfunction of lumbar region: Secondary | ICD-10-CM | POA: Diagnosis not present

## 2022-10-15 DIAGNOSIS — M4721 Other spondylosis with radiculopathy, occipito-atlanto-axial region: Secondary | ICD-10-CM | POA: Diagnosis not present

## 2022-10-17 DIAGNOSIS — M4728 Other spondylosis with radiculopathy, sacral and sacrococcygeal region: Secondary | ICD-10-CM | POA: Diagnosis not present

## 2022-10-17 DIAGNOSIS — M9904 Segmental and somatic dysfunction of sacral region: Secondary | ICD-10-CM | POA: Diagnosis not present

## 2022-10-17 DIAGNOSIS — M4724 Other spondylosis with radiculopathy, thoracic region: Secondary | ICD-10-CM | POA: Diagnosis not present

## 2022-10-17 DIAGNOSIS — M9901 Segmental and somatic dysfunction of cervical region: Secondary | ICD-10-CM | POA: Diagnosis not present

## 2022-10-17 DIAGNOSIS — M9902 Segmental and somatic dysfunction of thoracic region: Secondary | ICD-10-CM | POA: Diagnosis not present

## 2022-10-17 DIAGNOSIS — M4721 Other spondylosis with radiculopathy, occipito-atlanto-axial region: Secondary | ICD-10-CM | POA: Diagnosis not present

## 2022-10-17 DIAGNOSIS — M9903 Segmental and somatic dysfunction of lumbar region: Secondary | ICD-10-CM | POA: Diagnosis not present

## 2022-10-17 DIAGNOSIS — M4726 Other spondylosis with radiculopathy, lumbar region: Secondary | ICD-10-CM | POA: Diagnosis not present

## 2022-10-29 DIAGNOSIS — M9902 Segmental and somatic dysfunction of thoracic region: Secondary | ICD-10-CM | POA: Diagnosis not present

## 2022-10-29 DIAGNOSIS — M4724 Other spondylosis with radiculopathy, thoracic region: Secondary | ICD-10-CM | POA: Diagnosis not present

## 2022-10-29 DIAGNOSIS — M4726 Other spondylosis with radiculopathy, lumbar region: Secondary | ICD-10-CM | POA: Diagnosis not present

## 2022-10-29 DIAGNOSIS — M4721 Other spondylosis with radiculopathy, occipito-atlanto-axial region: Secondary | ICD-10-CM | POA: Diagnosis not present

## 2022-10-29 DIAGNOSIS — M4728 Other spondylosis with radiculopathy, sacral and sacrococcygeal region: Secondary | ICD-10-CM | POA: Diagnosis not present

## 2022-10-29 DIAGNOSIS — M9903 Segmental and somatic dysfunction of lumbar region: Secondary | ICD-10-CM | POA: Diagnosis not present

## 2022-10-29 DIAGNOSIS — M9904 Segmental and somatic dysfunction of sacral region: Secondary | ICD-10-CM | POA: Diagnosis not present

## 2022-10-29 DIAGNOSIS — M9901 Segmental and somatic dysfunction of cervical region: Secondary | ICD-10-CM | POA: Diagnosis not present

## 2022-11-04 DIAGNOSIS — M9902 Segmental and somatic dysfunction of thoracic region: Secondary | ICD-10-CM | POA: Diagnosis not present

## 2022-11-04 DIAGNOSIS — M9901 Segmental and somatic dysfunction of cervical region: Secondary | ICD-10-CM | POA: Diagnosis not present

## 2022-11-04 DIAGNOSIS — M9904 Segmental and somatic dysfunction of sacral region: Secondary | ICD-10-CM | POA: Diagnosis not present

## 2022-11-04 DIAGNOSIS — M4728 Other spondylosis with radiculopathy, sacral and sacrococcygeal region: Secondary | ICD-10-CM | POA: Diagnosis not present

## 2022-11-04 DIAGNOSIS — M9903 Segmental and somatic dysfunction of lumbar region: Secondary | ICD-10-CM | POA: Diagnosis not present

## 2022-11-04 DIAGNOSIS — M4721 Other spondylosis with radiculopathy, occipito-atlanto-axial region: Secondary | ICD-10-CM | POA: Diagnosis not present

## 2022-11-04 DIAGNOSIS — M4724 Other spondylosis with radiculopathy, thoracic region: Secondary | ICD-10-CM | POA: Diagnosis not present

## 2022-11-04 DIAGNOSIS — M4726 Other spondylosis with radiculopathy, lumbar region: Secondary | ICD-10-CM | POA: Diagnosis not present

## 2022-12-02 DIAGNOSIS — Z5181 Encounter for therapeutic drug level monitoring: Secondary | ICD-10-CM | POA: Diagnosis not present

## 2022-12-17 DIAGNOSIS — Z23 Encounter for immunization: Secondary | ICD-10-CM | POA: Diagnosis not present

## 2022-12-17 DIAGNOSIS — R350 Frequency of micturition: Secondary | ICD-10-CM | POA: Diagnosis not present

## 2022-12-17 DIAGNOSIS — Z681 Body mass index (BMI) 19 or less, adult: Secondary | ICD-10-CM | POA: Diagnosis not present

## 2023-01-17 DIAGNOSIS — Z01419 Encounter for gynecological examination (general) (routine) without abnormal findings: Secondary | ICD-10-CM | POA: Diagnosis not present

## 2023-01-17 DIAGNOSIS — Z1331 Encounter for screening for depression: Secondary | ICD-10-CM | POA: Diagnosis not present

## 2023-01-22 DIAGNOSIS — R110 Nausea: Secondary | ICD-10-CM | POA: Diagnosis not present

## 2023-01-22 DIAGNOSIS — I517 Cardiomegaly: Secondary | ICD-10-CM | POA: Diagnosis not present

## 2023-01-22 DIAGNOSIS — G40909 Epilepsy, unspecified, not intractable, without status epilepticus: Secondary | ICD-10-CM | POA: Diagnosis not present

## 2023-01-22 DIAGNOSIS — H5500 Unspecified nystagmus: Secondary | ICD-10-CM | POA: Diagnosis not present

## 2023-01-22 DIAGNOSIS — R42 Dizziness and giddiness: Secondary | ICD-10-CM | POA: Diagnosis not present

## 2023-01-22 DIAGNOSIS — H8110 Benign paroxysmal vertigo, unspecified ear: Secondary | ICD-10-CM | POA: Diagnosis not present

## 2023-01-23 DIAGNOSIS — R42 Dizziness and giddiness: Secondary | ICD-10-CM | POA: Diagnosis not present

## 2023-02-20 DIAGNOSIS — D270 Benign neoplasm of right ovary: Secondary | ICD-10-CM | POA: Diagnosis not present

## 2023-03-03 DIAGNOSIS — Z9689 Presence of other specified functional implants: Secondary | ICD-10-CM | POA: Diagnosis not present

## 2023-03-03 DIAGNOSIS — G40319 Generalized idiopathic epilepsy and epileptic syndromes, intractable, without status epilepticus: Secondary | ICD-10-CM | POA: Diagnosis not present

## 2023-04-01 DIAGNOSIS — F32A Depression, unspecified: Secondary | ICD-10-CM | POA: Diagnosis not present

## 2023-04-01 DIAGNOSIS — R569 Unspecified convulsions: Secondary | ICD-10-CM | POA: Diagnosis not present

## 2023-04-01 DIAGNOSIS — F419 Anxiety disorder, unspecified: Secondary | ICD-10-CM | POA: Diagnosis not present

## 2023-04-01 DIAGNOSIS — Z881 Allergy status to other antibiotic agents status: Secondary | ICD-10-CM | POA: Diagnosis not present

## 2023-04-01 DIAGNOSIS — G40319 Generalized idiopathic epilepsy and epileptic syndromes, intractable, without status epilepticus: Secondary | ICD-10-CM | POA: Diagnosis not present

## 2023-04-01 DIAGNOSIS — Z79899 Other long term (current) drug therapy: Secondary | ICD-10-CM | POA: Diagnosis not present

## 2023-04-01 DIAGNOSIS — Z9689 Presence of other specified functional implants: Secondary | ICD-10-CM | POA: Diagnosis not present

## 2023-04-01 DIAGNOSIS — Z888 Allergy status to other drugs, medicaments and biological substances status: Secondary | ICD-10-CM | POA: Diagnosis not present

## 2023-04-09 DIAGNOSIS — Z9689 Presence of other specified functional implants: Secondary | ICD-10-CM | POA: Diagnosis not present

## 2023-04-09 DIAGNOSIS — R4182 Altered mental status, unspecified: Secondary | ICD-10-CM | POA: Diagnosis not present

## 2023-04-09 DIAGNOSIS — Z01818 Encounter for other preprocedural examination: Secondary | ICD-10-CM | POA: Diagnosis not present

## 2023-04-09 DIAGNOSIS — G40319 Generalized idiopathic epilepsy and epileptic syndromes, intractable, without status epilepticus: Secondary | ICD-10-CM | POA: Diagnosis not present

## 2023-04-17 DIAGNOSIS — R4182 Altered mental status, unspecified: Secondary | ICD-10-CM | POA: Diagnosis not present

## 2023-05-20 DIAGNOSIS — G40319 Generalized idiopathic epilepsy and epileptic syndromes, intractable, without status epilepticus: Secondary | ICD-10-CM | POA: Diagnosis not present

## 2023-06-12 DIAGNOSIS — G40319 Generalized idiopathic epilepsy and epileptic syndromes, intractable, without status epilepticus: Secondary | ICD-10-CM | POA: Diagnosis not present

## 2023-07-16 ENCOUNTER — Ambulatory Visit
Admission: EM | Admit: 2023-07-16 | Discharge: 2023-07-16 | Disposition: A | Discharge status: Home / Self Care | Attending: Family Medicine | Admitting: Family Medicine

## 2023-07-16 DIAGNOSIS — R300 Dysuria: Secondary | ICD-10-CM

## 2023-07-16 LAB — POCT URINALYSIS DIP (MANUAL ENTRY)
Bilirubin, UA: NEGATIVE
Blood, UA: NEGATIVE
Glucose, UA: NEGATIVE mg/dL
Ketones, POC UA: NEGATIVE mg/dL
Leukocytes, UA: NEGATIVE
Nitrite, UA: NEGATIVE
Protein Ur, POC: NEGATIVE mg/dL
Spec Grav, UA: 1.02 (ref 1.010–1.025)
Urobilinogen, UA: 0.2 U/dL
pH, UA: 7 (ref 5.0–8.0)

## 2023-07-16 NOTE — ED Provider Notes (Signed)
 [name]    CSN: [id number] Arrival date & time: 07/16/23  0801      History   Chief Complaint Chief Complaint  Patient presents with   Dysuria    HPI Megan Mueller is a 33 y.o. female.   HPI 33 year old female presents with dysuria for 2 days.  Reports taking AZO as needed.  PMH significant for seizures and epilepsy.  Past Medical History:  Diagnosis Date   Allergic rhinitis    Epilepsy (HCC)    Epilepsy (HCC)    Seizures (HCC)     Patient Active Problem List   Diagnosis Date Noted   Annual physical exam 09/01/2015   Seizure disorder, grand mal (HCC) 09/01/2015    Past Surgical History:  Procedure Laterality Date   EYE SURGERY Left    HAND SURGERY Left    OTHER SURGICAL HISTORY     Vagas Nerve Stimulator implant    OB History   No obstetric history on file.      Home Medications    Prior to Admission medications   Medication Sig Start Date End Date Taking? Authorizing Provider  AMITRIPTYLINE HCL PO Take by mouth.    [provider]  benzonatate (TESSALON) 200 MG capsule Take 1 capsule (200 mg total) by mouth 2 (two) times daily as needed for cough. 03/07/22   Eustace Moore, MD  fexofenadine [facility]) 180 MG tablet Take 1 tablet (180 mg total) by mouth daily for 15 days. 09/20/22 10/05/22  Trevor Iha, FNP  fluticasone (FLONASE) 50 MCG/ACT nasal spray Place 1 spray into both nostrils daily for 7 days. 09/27/20 10/04/20  Orvil Feil, PA-C  ipratropium (ATROVENT) 0.06 % nasal spray Place 2 sprays into both nostrils 4 (four) times daily. 09/24/19   Lurene Shadow, PA-C  lacosamide (VIMPAT) 200 MG TABS tablet Take 200 mg by mouth 2 (two) times daily.    [provider]  lamoTRIgine (LAMICTAL) 150 MG tablet Take 150 mg by mouth daily.    [provider]  meclizine (ANTIVERT) 25 MG tablet Take 1 tablet (25 mg total) by mouth 3 (three) times daily as needed for dizziness. 09/20/22   Trevor Iha, FNP   sertraline (ZOLOFT) 50 MG tablet Take 50 mg by mouth daily.    [provider]  zonisamide (ZONEGRAN) 100 MG capsule Take 100 mg by mouth daily.    [provider]    Family History Family History  Problem Relation Age of Onset   Hyperlipidemia Mother    Hypertension Father    Diabetes Father     Social History Social History   Tobacco Use   Smoking status: Never   Smokeless tobacco: Never  Vaping Use   Vaping status: Never Used  Substance Use Topics   Alcohol use: Yes    Comment: socially   Drug use: No     Allergies   Antihistamines, diphenhydramine-type; Doxycycline; and Pseudoephedrine   Review of Systems Review of Systems   Physical Exam Triage Vital Signs ED Triage Vitals  Encounter Vitals Group     BP 07/16/23 0823 106/71     Systolic BP Percentile --      Diastolic BP Percentile --      Pulse Rate 07/16/23 0823 73     Resp 07/16/23 0823 17     Temp 07/16/23 0823 98.3 F (36.8 C)     Temp Source 07/16/23 0823 Oral     SpO2 07/16/23 0823 100 %  Weight --      Height --      Head Circumference --      Peak Flow --      Pain Score 07/16/23 0824 0     Pain Loc --      Pain Education --      Exclude from Growth Chart --    No data found.  Updated Vital Signs BP 106/71 (BP Location: Right Arm)   Pulse 73   Temp 98.3 F (36.8 C) (Oral)   Resp 17   LMP  (LMP Unknown)   SpO2 100%   Visual Acuity Right Eye Distance:   Left Eye Distance:   Bilateral Distance:    Right Eye Near:   Left Eye Near:    Bilateral Near:     Physical Exam Vitals and nursing note reviewed.  Constitutional:      Appearance: Normal appearance. She is normal weight.  HENT:     Head: Normocephalic and atraumatic.     Mouth/Throat:     Mouth: Mucous membranes are moist.     Pharynx: Oropharynx is clear.  Eyes:     Extraocular Movements: Extraocular movements intact.     Conjunctiva/sclera: Conjunctivae normal.     Pupils: Pupils are equal,  round, and reactive to light.  Cardiovascular:     Rate and Rhythm: Normal rate and regular rhythm.     Pulses: Normal pulses.     Heart sounds: Normal heart sounds.  Pulmonary:     Effort: Pulmonary effort is normal.     Breath sounds: Normal breath sounds. No wheezing, rhonchi or rales.  Musculoskeletal:        General: Normal range of motion.     Cervical back: Normal range of motion and neck supple.  Skin:    General: Skin is warm and dry.  Neurological:     General: No focal deficit present.     Mental Status: She is alert and oriented to person, place, and time. Mental status is at baseline.  Psychiatric:        Mood and Affect: Mood normal.        Behavior: Behavior normal.      UC Treatments / Results  Labs (all labs ordered are listed, but only abnormal results are displayed) Labs Reviewed  POCT URINALYSIS DIP (MANUAL ENTRY) - Abnormal; Notable for the following components:      Result Value   Clarity, UA cloudy (*)    All other components within normal limits    EKG   Radiology No results found.  Procedures Procedures (including critical care time)  Medications Ordered in UC Medications - No data to display  Initial Impression / Assessment and Plan / UC Course  I have reviewed the triage vital signs and the nursing notes.  Pertinent labs & imaging results that were available during my care of the patient were reviewed by me and considered in my medical decision making (see chart for details).     MDM: 1.  Dysuria-UA revealed above. Advised patient UA was unremarkable this morning.  Encouraged to increase daily water intake to 64 ounces per day 7 days/week advised if symptoms worsen and/or unresolved please follow-up with your PCP or here for further evaluation.  Patient discharged home, hemodynamically stable. Final Clinical Impressions(s) / UC Diagnoses   Final diagnoses:  Dysuria     Discharge Instructions      Advised patient UA was  unremarkable this morning.  Encouraged to increase daily  water intake to 64 ounces per day 7 days/week advised if symptoms worsen and/or unresolved please follow-up with your PCP or here for further evaluation.     ED Prescriptions   None    PDMP not reviewed this encounter.   Trevor Iha, FNP 07/16/23 [phone]

## 2023-07-16 NOTE — Discharge Instructions (Addendum)
 Advised patient UA was unremarkable this morning.  Encouraged to increase daily water intake to 64 ounces per day 7 days/week advised if symptoms worsen and/or unresolved please follow-up with your PCP or here for further evaluation.

## 2023-07-16 NOTE — ED Triage Notes (Signed)
 Pt c/o dysuria x 2 days. AZO prn. Last dose 2 days ago.

## 2023-07-31 DIAGNOSIS — Z9689 Presence of other specified functional implants: Secondary | ICD-10-CM | POA: Diagnosis not present

## 2023-07-31 DIAGNOSIS — F418 Other specified anxiety disorders: Secondary | ICD-10-CM | POA: Diagnosis not present

## 2023-07-31 DIAGNOSIS — G40319 Generalized idiopathic epilepsy and epileptic syndromes, intractable, without status epilepticus: Secondary | ICD-10-CM | POA: Diagnosis not present

## 2023-07-31 DIAGNOSIS — Z01818 Encounter for other preprocedural examination: Secondary | ICD-10-CM | POA: Diagnosis not present

## 2023-07-31 DIAGNOSIS — D689 Coagulation defect, unspecified: Secondary | ICD-10-CM | POA: Diagnosis not present

## 2023-08-05 DIAGNOSIS — Z4542 Encounter for adjustment and management of neuropacemaker (brain) (peripheral nerve) (spinal cord): Secondary | ICD-10-CM | POA: Diagnosis not present

## 2023-08-06 DIAGNOSIS — Z4542 Encounter for adjustment and management of neuropacemaker (brain) (peripheral nerve) (spinal cord): Secondary | ICD-10-CM | POA: Diagnosis not present

## 2023-08-11 ENCOUNTER — Ambulatory Visit
Admission: EM | Admit: 2023-08-11 | Discharge: 2023-08-11 | Disposition: A | Discharge status: Home / Self Care | Attending: Family Medicine | Admitting: Family Medicine

## 2023-08-11 ENCOUNTER — Other Ambulatory Visit: Payer: Self-pay

## 2023-08-11 DIAGNOSIS — B370 Candidal stomatitis: Secondary | ICD-10-CM | POA: Diagnosis not present

## 2023-08-11 MED ORDER — NYSTATIN 100000 UNIT/ML MT SUSP: 500000.0000 [IU] | Freq: Four times a day (QID) | OROMUCOSAL | 1 refills | Status: DC

## 2023-08-11 NOTE — Discharge Instructions (Signed)
 Use the nystatin  oral swish as directed

## 2023-08-11 NOTE — ED Provider Notes (Signed)
 [facility]    CSN: [id number] Arrival date & time: 08/11/23  4098      History   Chief Complaint Chief Complaint  Patient presents with   Mouth Injury    HPI Megan Mueller is a 33 y.o. female.    Patient had a surgical procedure on 08/05/2023.  She states she received antibiotics in the hospital.  She now has a painful mouth.  She states there is white patches in her mouth.  She thinks is thrush. No cough cold or runny nose symptoms. Otherwise is recovering well from her surgery HPI  Past Medical History:  Diagnosis Date   Allergic rhinitis    Epilepsy (HCC)    Epilepsy (HCC)    Seizures (HCC)     Patient Active Problem List   Diagnosis Date Noted   Annual physical exam 09/01/2015   Seizure disorder, grand mal (HCC) 09/01/2015    Past Surgical History:  Procedure Laterality Date   EYE SURGERY Left    HAND SURGERY Left    OTHER SURGICAL HISTORY     Vagas Nerve Stimulator implant   rns device placement      OB History   No obstetric history on file.      Home Medications    Prior to Admission medications   Medication Sig Start Date End Date Taking? Authorizing Provider  nystatin  (MYCOSTATIN ) 100000 UNIT/ML suspension Take 5 mLs (500,000 Units total) by mouth 4 (four) times daily. Can swish in mouth and spit or swallow 08/11/23  Yes Stephany Ehrich, MD  AMITRIPTYLINE HCL PO Take by mouth.    [provider]  lacosamide  (VIMPAT ) 200 MG TABS tablet Take 200 mg by mouth 2 (two) times daily.    [provider]  lamoTRIgine  (LAMICTAL ) 150 MG tablet Take 150 mg by mouth daily.    [provider]  sertraline (ZOLOFT) 50 MG tablet Take 50 mg by mouth daily.    [provider]  zonisamide  (ZONEGRAN ) 100 MG capsule Take 100 mg by mouth daily.    [provider]    Family History Family History  Problem Relation Age of Onset   Hyperlipidemia Mother    Hypertension Father    Diabetes Father      Social History Social History   Tobacco Use   Smoking status: Never   Smokeless tobacco: Never  Vaping Use   Vaping status: Never Used  Substance Use Topics   Alcohol use: Not Currently    Comment: socially   Drug use: No     Allergies   Antihistamines, diphenhydramine-type; Doxycycline ; and Pseudoephedrine   Review of Systems Review of Systems See HPI  Physical Exam Triage Vital Signs ED Triage Vitals  Encounter Vitals Group     BP 08/11/23 0831 102/68     Systolic BP Percentile --      Diastolic BP Percentile --      Pulse Rate 08/11/23 0831 97     Resp 08/11/23 0831 16     Temp 08/11/23 0831 97.9 F (36.6 C)     Temp src --      SpO2 08/11/23 0831 98 %     Weight --      Height --      Head Circumference --      Peak Flow --      Pain Score 08/11/23 0835 5     Pain Loc --      Pain Education --  Exclude from Growth Chart --    No data found.  Updated Vital Signs BP 102/68   Pulse 97   Temp 97.9 F (36.6 C)   Resp 16   LMP 08/05/2023 (Approximate)   SpO2 98%      Physical Exam Constitutional:      General: She is not in acute distress.    Appearance: She is well-developed and normal weight.     Comments: Appears tired.  Frail.  Bandages to head are in place  HENT:     Head: Normocephalic and atraumatic.     Mouth/Throat:     Mouth: Mucous membranes are moist.     Pharynx: Posterior oropharyngeal erythema present.     Comments: Oropharynx has some erythema of the posterior pharynx, soft palate, and tongue.  White patches are noted Eyes:     Conjunctiva/sclera: Conjunctivae normal.     Pupils: Pupils are equal, round, and reactive to light.  Cardiovascular:     Rate and Rhythm: Normal rate.  Pulmonary:     Effort: Pulmonary effort is normal. No respiratory distress.  Musculoskeletal:        General: Normal range of motion.     Cervical back: Normal range of motion.  Lymphadenopathy:     Cervical: Cervical adenopathy present.   Skin:    General: Skin is warm and dry.  Neurological:     Mental Status: She is alert.      UC Treatments / Results  Labs (all labs ordered are listed, but only abnormal results are displayed) Labs Reviewed - No data to display  EKG   Radiology No results found.  Procedures Procedures (including critical care time)  Medications Ordered in UC Medications - No data to display  Initial Impression / Assessment and Plan / UC Course  I have reviewed the triage vital signs and the nursing notes.  Pertinent labs & imaging results that were available during my care of the patient were reviewed by me and considered in my medical decision making (see chart for details).     Final Clinical Impressions(s) / UC Diagnoses   Final diagnoses:  Thrush, oral     Discharge Instructions      Use the nystatin  oral swish as directed    ED Prescriptions     Medication Sig Dispense Auth. Provider   nystatin  (MYCOSTATIN ) 100000 UNIT/ML suspension Take 5 mLs (500,000 Units total) by mouth 4 (four) times daily. Can swish in mouth and spit or swallow 120 mL Stephany Ehrich, MD      PDMP not reviewed this encounter.   Stephany Ehrich, MD 08/11/23 1024

## 2023-08-11 NOTE — ED Triage Notes (Signed)
 C/o mouth pain x 2 days. Was in hospital for brain surgery and was intubated so it may be related to that. Surgery was 08/05/23. Did have white patches to tongue (has a picture). Has been taking tylenol .

## 2023-08-12 DIAGNOSIS — Z881 Allergy status to other antibiotic agents status: Secondary | ICD-10-CM | POA: Diagnosis not present

## 2023-08-12 DIAGNOSIS — R112 Nausea with vomiting, unspecified: Secondary | ICD-10-CM | POA: Diagnosis not present

## 2023-08-12 DIAGNOSIS — I6200 Nontraumatic subdural hemorrhage, unspecified: Secondary | ICD-10-CM | POA: Diagnosis not present

## 2023-08-12 DIAGNOSIS — R059 Cough, unspecified: Secondary | ICD-10-CM | POA: Diagnosis not present

## 2023-08-12 DIAGNOSIS — K922 Gastrointestinal hemorrhage, unspecified: Secondary | ICD-10-CM | POA: Diagnosis not present

## 2023-08-12 DIAGNOSIS — Z9682 Presence of neurostimulator: Secondary | ICD-10-CM | POA: Diagnosis not present

## 2023-08-28 DIAGNOSIS — G40319 Generalized idiopathic epilepsy and epileptic syndromes, intractable, without status epilepticus: Secondary | ICD-10-CM | POA: Diagnosis not present

## 2023-08-28 DIAGNOSIS — Z9682 Presence of neurostimulator: Secondary | ICD-10-CM | POA: Diagnosis not present

## 2023-10-20 DIAGNOSIS — G40319 Generalized idiopathic epilepsy and epileptic syndromes, intractable, without status epilepticus: Secondary | ICD-10-CM | POA: Diagnosis not present

## 2023-10-20 DIAGNOSIS — Z5181 Encounter for therapeutic drug level monitoring: Secondary | ICD-10-CM | POA: Diagnosis not present

## 2023-10-20 DIAGNOSIS — Z9689 Presence of other specified functional implants: Secondary | ICD-10-CM | POA: Diagnosis not present

## 2024-01-14 DIAGNOSIS — Z9689 Presence of other specified functional implants: Secondary | ICD-10-CM | POA: Diagnosis not present

## 2024-01-14 DIAGNOSIS — G40319 Generalized idiopathic epilepsy and epileptic syndromes, intractable, without status epilepticus: Secondary | ICD-10-CM | POA: Diagnosis not present

## 2024-01-14 DIAGNOSIS — Z5181 Encounter for therapeutic drug level monitoring: Secondary | ICD-10-CM | POA: Diagnosis not present

## 2024-01-15 DIAGNOSIS — G40319 Generalized idiopathic epilepsy and epileptic syndromes, intractable, without status epilepticus: Secondary | ICD-10-CM | POA: Diagnosis not present

## 2024-01-15 DIAGNOSIS — Z5181 Encounter for therapeutic drug level monitoring: Secondary | ICD-10-CM | POA: Diagnosis not present

## 2024-01-15 DIAGNOSIS — Z79899 Other long term (current) drug therapy: Secondary | ICD-10-CM | POA: Diagnosis not present

## 2024-03-04 DIAGNOSIS — G40319 Generalized idiopathic epilepsy and epileptic syndromes, intractable, without status epilepticus: Secondary | ICD-10-CM | POA: Diagnosis not present

## 2024-03-23 ENCOUNTER — Ambulatory Visit
Admission: EM | Admit: 2024-03-23 | Discharge: 2024-03-23 | Disposition: A | Discharge status: Home / Self Care | Attending: Family Medicine | Admitting: Family Medicine

## 2024-03-23 DIAGNOSIS — R0981 Nasal congestion: Secondary | ICD-10-CM

## 2024-03-23 MED ORDER — CLOTRIMAZOLE 10 MG MT TROC: 10.0000 mg | Freq: Every day | OROMUCOSAL | 0 refills | Status: AC

## 2024-03-23 MED ORDER — AZELASTINE-FLUTICASONE 137-50 MCG/ACT NA SUSP: 1.0000 | Freq: Two times a day (BID) | NASAL | 0 refills | Status: AC

## 2024-03-23 NOTE — ED Triage Notes (Signed)
 Pt c/o runny nose/nasal congestion x 3-4 days. Want to also r/o  oral thrush which started a couple days ago. Flonase  prn.

## 2024-03-23 NOTE — Discharge Instructions (Signed)
 Try Dymista  instead of the Flonase  to help with the nasal congestion and postnasal drip I have prescribed clotrimazole  for possible yeast/thrush Follow-up if needed

## 2024-03-23 NOTE — ED Provider Notes (Signed)
 [facility]    CSN: [id number] Arrival date & time: 03/23/24  0911      History   Chief Complaint Chief Complaint  Patient presents with   Nasal Congestion    HPI Megan Mueller is a 33 y.o. female.   HPI  Patient has nasal congestion.  She is only getting partial relief from her Flonase .  She also complains of soreness on her tongue.  She feels like she might have thrush again.  She states she saw some white patches.  She scraped these off.  Now she just has a sore spot.  Past Medical History:  Diagnosis Date   Allergic rhinitis    Epilepsy (HCC)    Epilepsy (HCC)    Seizures (HCC)     Patient Active Problem List   Diagnosis Date Noted   Annual physical exam 09/01/2015   Seizure disorder, grand mal (HCC) 09/01/2015    Past Surgical History:  Procedure Laterality Date   EYE SURGERY Left    HAND SURGERY Left    OTHER SURGICAL HISTORY     Vagas Nerve Stimulator implant   rns device placement      OB History   No obstetric history on file.      Home Medications    Prior to Admission medications   Medication Sig Start Date End Date Taking? Authorizing Provider  Azelastine-Fluticasone  137-50 MCG/ACT SUSP Place 1 puff into the nose in the morning and at bedtime. 03/23/24  Yes Maranda Jamee Jacob, MD  clotrimazole [facility]) 10 MG troche Take 1 tablet (10 mg total) by mouth 5 (five) times daily. 03/23/24  Yes Maranda Jamee Jacob, MD  AMITRIPTYLINE HCL PO Take by mouth.    [provider]  lacosamide  (VIMPAT ) 200 MG TABS tablet Take 200 mg by mouth 2 (two) times daily.    [provider]  lamoTRIgine  (LAMICTAL ) 150 MG tablet Take 150 mg by mouth daily.    [provider]  sertraline (ZOLOFT) 50 MG tablet Take 50 mg by mouth daily.    [provider]  zonisamide  (ZONEGRAN ) 100 MG capsule Take 100 mg by mouth daily.    [provider]    Family History Family History  Problem Relation Age of Onset    Hyperlipidemia Mother    Hypertension Father    Diabetes Father     Social History Social History   Tobacco Use   Smoking status: Never   Smokeless tobacco: Never  Vaping Use   Vaping status: Never Used  Substance Use Topics   Alcohol use: Not Currently    Comment: socially   Drug use: No     Allergies   Antihistamines, diphenhydramine-type; Doxycycline ; and Pseudoephedrine   Review of Systems Review of Systems  See HPI Physical Exam Triage Vital Signs ED Triage Vitals  Encounter Vitals Group     BP 03/23/24 0927 98/69     Girls Systolic BP Percentile --      Girls Diastolic BP Percentile --      Boys Systolic BP Percentile --      Boys Diastolic BP Percentile --      Pulse Rate 03/23/24 0927 76     Resp 03/23/24 0927 17     Temp 03/23/24 0927 98.8 F (37.1 C)     Temp Source 03/23/24 0927 Oral     SpO2 03/23/24 0927 99 %     Weight --      Height --      Head  Circumference --      Peak Flow --      Pain Score 03/23/24 0929 0     Pain Loc --      Pain Education --      Exclude from Growth Chart --    No data found.  Updated Vital Signs BP 98/69 (BP Location: Right Arm)   Pulse 76   Temp 98.8 F (37.1 C) (Oral)   Resp 17   LMP 03/11/2024 (Approximate)   SpO2 99%  :     Physical Exam Constitutional:      General: She is not in acute distress.    Appearance: She is well-developed and normal weight.  HENT:     Head: Normocephalic and atraumatic.     Right Ear: Tympanic membrane normal.     Left Ear: Tympanic membrane normal.     Nose: Congestion and rhinorrhea present.     Comments: Nasal congestion.  Clear rhinorrhea.    Mouth/Throat:     Comments: Erythema on the side of the tongue with no exudate Eyes:     Conjunctiva/sclera: Conjunctivae normal.     Pupils: Pupils are equal, round, and reactive to light.  Cardiovascular:     Rate and Rhythm: Normal rate.  Pulmonary:     Effort: Pulmonary effort is normal. No respiratory distress.   Musculoskeletal:        General: Normal range of motion.     Cervical back: Normal range of motion.  Skin:    General: Skin is warm and dry.  Neurological:     Mental Status: She is alert.      UC Treatments / Results  Labs (all labs ordered are listed, but only abnormal results are displayed) Labs Reviewed - No data to display  EKG   Radiology No results found.  Procedures Procedures (including critical care time)  Medications Ordered in UC Medications - No data to display  Initial Impression / Assessment and Plan / UC Course  I have reviewed the triage vital signs and the nursing notes.  Pertinent labs & imaging results that were available during my care of the patient were reviewed by me and considered in my medical decision making (see chart for details).     Final Clinical Impressions(s) / UC Diagnoses   Final diagnoses:  Nasal congestion     Discharge Instructions      Try Dymista instead of the Flonase  to help with the nasal congestion and postnasal drip I have prescribed clotrimazole for possible yeast/thrush Follow-up if needed   ED Prescriptions     Medication Sig Dispense Auth. Provider   Azelastine-Fluticasone  137-50 MCG/ACT SUSP Place 1 puff into the nose in the morning and at bedtime. 23 g Maranda Jamee Jacob, MD   clotrimazole [facility]) 10 MG troche Take 1 tablet (10 mg total) by mouth 5 (five) times daily. 20 Troche Maranda Jamee Jacob, MD      PDMP not reviewed this encounter.   Maranda Jamee Jacob, MD 03/23/24 [phone]
